# Patient Record
Sex: Male | Born: 1940 | Race: Black or African American | Hispanic: No | Marital: Married | Smoking: Former smoker
Health system: Southern US, Community
[De-identification: ages and names within clinical notes are randomized; demographics above are authoritative.]

## PROBLEM LIST (undated history)

## (undated) DIAGNOSIS — I10 Essential (primary) hypertension: Secondary | ICD-10-CM

## (undated) DIAGNOSIS — F329 Major depressive disorder, single episode, unspecified: Secondary | ICD-10-CM

## (undated) DIAGNOSIS — M1990 Unspecified osteoarthritis, unspecified site: Secondary | ICD-10-CM

## (undated) DIAGNOSIS — E119 Type 2 diabetes mellitus without complications: Secondary | ICD-10-CM

## (undated) DIAGNOSIS — F32A Depression, unspecified: Secondary | ICD-10-CM

## (undated) HISTORY — PX: EYE SURGERY: SHX253

## (undated) HISTORY — PX: APPENDECTOMY: SHX54

## (undated) HISTORY — PX: BACK SURGERY: SHX140

---
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
## 2018-12-03 ENCOUNTER — Other Ambulatory Visit: Payer: Self-pay | Admitting: Orthopedic Surgery

## 2018-12-23 NOTE — Patient Instructions (Addendum)
Curtis Mason  12/23/2018   Your procedure is scheduled on: Monday 12/29/2018   [facility]              Report to admitting at  0945  AM               YOU NEED TO HAVE A COVID 19 TEST ON 12-25-18  @ 10:15 AM_______, THIS TEST MUST BE DONE BEFORE SURGERY, [facility].              ONCE YOUR COVID TEST IS COMPLETED, PLEASE BEGIN THE QUARANTINE INSTRUCTIONS AS OUTLINED IN YOUR HANDOUT.   Call this number if you have problems the morning of surgery [phone]    Remember: Do not eat food  :After Midnight.               NO SOLID FOOD AFTER MIDNIGHT THE NIGHT PRIOR TO SURGERY. NOTHING BY MOUTH EXCEPT CLEAR LIQUIDS UNTIL 3 HOURS PRIOR TO SCHEULED SURGERY.             PLEASE FINISH  Gatorade G 2  DRINK PER SURGEON ORDER 3 HOURS PRIOR TO SCHEDULED SURGERY TIME WHICH NEEDS TO BE COMPLETED AT  0915 am.   CLEAR LIQUID DIET   Foods Allowed                                                                     Foods Excluded  Coffee and tea, regular and decaf                             liquids that you cannot  Plain Jell-O in any flavor                                             see through such as: Fruit ices (not with fruit pulp)                                     milk, soups, orange juice  Iced Popsicles                                    All solid food Carbonated beverages, regular and diet                                    Cranberry, grape and apple juices Sports drinks like Gatorade Lightly seasoned clear broth or consume(fat free) Sugar, honey syrup  Sample Menu Breakfast                                Lunch  Supper Cranberry juice                    Beef broth                            Chicken broth Jell-O                                     Grape juice                           Apple juice Coffee or tea                        Jell-O                                       Popsicle                                                Coffee or tea                        Coffee or tea  _____________________________________________________________________               BRUSH YOUR TEETH MORNING OF SURGERY AND RINSE YOUR MOUTH OUT, NO CHEWING GUM CANDY OR MINTS.     Take these medicines the morning of surgery with A SIP OF WATER: Atorvastatin (Lipitor), Duloxetine (Cymbalta), use eye drops              Do not take any oral Diabetic medications the morning of surgery!                               You may not have any metal on your body including hair pins and              piercings  Do not wear jewelry, cologne, lotions, powders or deodorant                         Men may shave face and neck.   Do not bring valuables to the hospital. [facility] IS NOT             RESPONSIBLE   FOR VALUABLES.  Contacts, dentures or bridgework may not be worn into surgery.                  Please read over the following fact sheets you were given: _____________________________________________________________________  How to Manage Your Diabetes Before and After Surgery  Why is it important to control my blood sugar before and after surgery? . Improving blood sugar levels before and after surgery helps healing and can limit problems. . A way of improving blood sugar control is eating a healthy diet by: o  Eating less sugar and carbohydrates o  Increasing activity/exercise o  Talking with your doctor about reaching your blood sugar goals . High blood sugars (greater than 180 mg/dL) can raise your risk of infections and slow  your recovery, so you will need to focus on controlling your diabetes during the weeks before surgery. . Make sure that the doctor who takes care of your diabetes knows about your planned surgery including the date and location.  How do I manage my blood sugar before surgery? . Check your blood sugar at least 4 times a day, starting 2  days before surgery, to make sure that the level is not too high or low. o Check your blood sugar the morning of your surgery when you wake up and every 2 hours until you get to the Short Stay unit. . If your blood sugar is less than 70 mg/dL, you will need to treat for low blood sugar: o Do not take insulin. o Treat a low blood sugar (less than 70 mg/dL) with  cup of clear juice (cranberry or apple), 4 glucose tablets, OR glucose gel. o Recheck blood sugar in 15 minutes after treatment (to make sure it is greater than 70 mg/dL). If your blood sugar is not greater than 70 mg/dL on recheck, call [phone] for further instructions. . Report your blood sugar to the short stay nurse when you get to Short Stay.  . If you are admitted to the hospital after surgery: o Your blood sugar will be checked by the staff and you will probably be given insulin after surgery (instead of oral diabetes medicines) to make sure you have good blood sugar levels. o The goal for blood sugar control after surgery is 80-180 mg/dL.   WHAT DO I DO ABOUT MY DIABETES MEDICATION?         The day before surgery, Take Metformin (Glucophage) as usual.          The day before surgery, Take the Morning dose of Insulin Aspart (Novolog Flexpen) but DO NOT TAKE EVENING DOSE !  . Do not take oral diabetes medicines (pills) the morning of surgery.  . THE NIGHT BEFORE SURGERY, take    15  units of  Lantus     insulin.      . If your CBG is greater than 220 mg/dL, you may take  of your sliding scale  . (correction) dose of insulin.               Curtis Mason - Preparing for Surgery Before surgery, you can play an important role.  Because skin is not sterile, your skin needs to be as free of germs as possible.  You can reduce the number of germs on your skin by washing with CHG (chlorahexidine gluconate) soap before surgery.  CHG is an antiseptic cleaner which kills germs and bonds with the skin to continue killing germs even  after washing. Please DO NOT use if you have an allergy to CHG or antibacterial soaps.  If your skin becomes reddened/irritated stop using the CHG and inform your nurse when you arrive at Short Stay. Do not shave (including legs and underarms) for at least 48 hours prior to the first CHG shower.  You may shave your face/neck. Please follow these instructions carefully:  1.  Shower with CHG Soap the night before surgery and the  morning of Surgery.  2.  If you choose to wash your hair, wash your hair first as usual with your  normal  shampoo.  3.  After you shampoo, rinse your hair and body thoroughly to remove the  shampoo.  4.  Use CHG as you would any other liquid soap.  You can apply chg directly  to the skin and wash                       Gently with a scrungie or clean washcloth.  5.  Apply the CHG Soap to your body ONLY FROM THE NECK DOWN.   Do not use on face/ open                           Wound or open sores. Avoid contact with eyes, ears mouth and genitals (private parts).                       Wash face,  Genitals (private parts) with your normal soap.             6.  Wash thoroughly, paying special attention to the area where your surgery  will be performed.  7.  Thoroughly rinse your body with warm water from the neck down.  8.  DO NOT shower/wash with your normal soap after using and rinsing off  the CHG Soap.                9.  Pat yourself dry with a clean towel.            10.  Wear clean pajamas.            11.  Place clean sheets on your bed the night of your first shower and do not  sleep with pets. Day of Surgery : Do not apply any lotions/deodorants the morning of surgery.  Please wear clean clothes to the hospital/surgery center.  FAILURE TO FOLLOW THESE INSTRUCTIONS MAY RESULT IN THE CANCELLATION OF YOUR SURGERY PATIENT SIGNATURE_________________________________  NURSE  SIGNATURE__________________________________  ________________________________________________________________________   Adam Phenix  An incentive spirometer is a tool that can help keep your lungs clear and active. This tool measures how well you are filling your lungs with each breath. Taking long deep breaths may help reverse or decrease the chance of developing breathing (pulmonary) problems (especially infection) following:  A long period of time when you are unable to move or be active. BEFORE THE PROCEDURE   If the spirometer includes an indicator to show your best effort, your nurse or respiratory therapist will set it to a desired goal.  If possible, sit up straight or lean slightly forward. Try not to slouch.  Hold the incentive spirometer in an upright position. INSTRUCTIONS FOR USE  1. Sit on the edge of your bed if possible, or sit up as far as you can in bed or on a chair. 2. Hold the incentive spirometer in an upright position. 3. Breathe out normally. 4. Place the mouthpiece in your mouth and seal your lips tightly around it. 5. Breathe in slowly and as deeply as possible, raising the piston or the ball toward the top of the column. 6. Hold your breath for 3-5 seconds or for as long as possible. Allow the piston or ball to fall to the bottom of the column. 7. Remove the mouthpiece from your mouth and breathe out normally. 8. Rest for a few seconds and repeat Steps 1 through 7 at least 10 times every 1-2 hours when you are awake. Take your time and take a few normal breaths between deep breaths. 9. The spirometer may include an indicator to show  your best effort. Use the indicator as a goal to work toward during each repetition. 10. After each set of 10 deep breaths, practice coughing to be sure your lungs are clear. If you have an incision (the cut made at the time of surgery), support your incision when coughing by placing a pillow or rolled up towels firmly  against it. Once you are able to get out of bed, walk around indoors and cough well. You may stop using the incentive spirometer when instructed by your caregiver.  RISKS AND COMPLICATIONS  Take your time so you do not get dizzy or light-headed.  If you are in pain, you may need to take or ask for pain medication before doing incentive spirometry. It is harder to take a deep breath if you are having pain. AFTER USE  Rest and breathe slowly and easily.  It can be helpful to keep track of a log of your progress. Your caregiver can provide you with a simple table to help with this. If you are using the spirometer at home, follow these instructions: [place] IF:   You are having difficultly using the spirometer.  You have trouble using the spirometer as often as instructed.  Your pain medication is not giving enough relief while using the spirometer.  You develop fever of 100.5 F (38.1 C) or higher. SEEK IMMEDIATE MEDICAL CARE IF:   You cough up bloody sputum that had not been present before.  You develop fever of 102 F (38.9 C) or greater.  You develop worsening pain at or near the incision site. MAKE SURE YOU:   Understand these instructions.  Will watch your condition.  Will get help right away if you are not doing well or get worse. Document Released: 10/29/2006 Document Revised: 09/10/2011 Document Reviewed: 12/30/2006 [facility] Patient Information 2014 [place], [place].   ________________________________________________________________________

## 2018-12-24 ENCOUNTER — Other Ambulatory Visit: Payer: Self-pay

## 2018-12-24 ENCOUNTER — Other Ambulatory Visit: Payer: Self-pay | Admitting: Orthopedic Surgery

## 2018-12-24 ENCOUNTER — Ambulatory Visit (HOSPITAL_COMMUNITY)
Admission: RE | Admit: 2018-12-24 | Discharge: 2018-12-24 | Disposition: A | Discharge status: Home / Self Care | Payer: Medicare Other | Source: Ambulatory Visit | Attending: Orthopedic Surgery | Admitting: Orthopedic Surgery

## 2018-12-24 ENCOUNTER — Encounter (HOSPITAL_COMMUNITY): Payer: Self-pay

## 2018-12-24 ENCOUNTER — Encounter (HOSPITAL_COMMUNITY)
Admission: RE | Admit: 2018-12-24 | Discharge: 2018-12-24 | Disposition: A | Discharge status: Home / Self Care | Payer: Medicare Other | Source: Ambulatory Visit | Attending: Orthopedic Surgery | Admitting: Orthopedic Surgery

## 2018-12-24 DIAGNOSIS — E119 Type 2 diabetes mellitus without complications: Secondary | ICD-10-CM | POA: Insufficient documentation

## 2018-12-24 DIAGNOSIS — Z01818 Encounter for other preprocedural examination: Secondary | ICD-10-CM | POA: Insufficient documentation

## 2018-12-24 DIAGNOSIS — Z794 Long term (current) use of insulin: Secondary | ICD-10-CM | POA: Insufficient documentation

## 2018-12-24 DIAGNOSIS — M1612 Unilateral primary osteoarthritis, left hip: Secondary | ICD-10-CM | POA: Insufficient documentation

## 2018-12-24 DIAGNOSIS — I10 Essential (primary) hypertension: Secondary | ICD-10-CM | POA: Diagnosis not present

## 2018-12-24 DIAGNOSIS — Z79899 Other long term (current) drug therapy: Secondary | ICD-10-CM | POA: Insufficient documentation

## 2018-12-24 DIAGNOSIS — Z87891 Personal history of nicotine dependence: Secondary | ICD-10-CM | POA: Insufficient documentation

## 2018-12-24 HISTORY — DX: Essential (primary) hypertension: I10

## 2018-12-24 HISTORY — DX: Type 2 diabetes mellitus without complications: E11.9

## 2018-12-24 HISTORY — DX: Unspecified osteoarthritis, unspecified site: M19.90

## 2018-12-24 HISTORY — DX: Depression, unspecified: F32.A

## 2018-12-24 LAB — CBC WITH DIFFERENTIAL/PLATELET
Abs Immature Granulocytes: 0.04 10*3/uL (ref 0.00–0.07)
Basophils Absolute: 0 10*3/uL (ref 0.0–0.1)
Basophils Relative: 1 %
Eosinophils Absolute: 0.1 10*3/uL (ref 0.0–0.5)
Eosinophils Relative: 2 %
HCT: 42.9 % (ref 39.0–52.0)
Hemoglobin: 13.3 g/dL (ref 13.0–17.0)
Immature Granulocytes: 1 %
Lymphocytes Relative: 29 %
Lymphs Abs: 1.9 10*3/uL (ref 0.7–4.0)
MCH: 27.1 pg (ref 26.0–34.0)
MCHC: 31 g/dL (ref 30.0–36.0)
MCV: 87.6 fL (ref 80.0–100.0)
Monocytes Absolute: 0.6 10*3/uL (ref 0.1–1.0)
Monocytes Relative: 9 %
Neutro Abs: 3.9 10*3/uL (ref 1.7–7.7)
Neutrophils Relative %: 58 %
Platelets: 173 10*3/uL (ref 150–400)
RBC: 4.9 MIL/uL (ref 4.22–5.81)
RDW: 13.9 % (ref 11.5–15.5)
WBC: 6.6 10*3/uL (ref 4.0–10.5)
nRBC: 0 % (ref 0.0–0.2)

## 2018-12-24 LAB — URINALYSIS, ROUTINE W REFLEX MICROSCOPIC
Bilirubin Urine: NEGATIVE
Glucose, UA: NEGATIVE mg/dL
Hgb urine dipstick: NEGATIVE
Ketones, ur: NEGATIVE mg/dL
Leukocytes,Ua: NEGATIVE
Nitrite: NEGATIVE
Protein, ur: NEGATIVE mg/dL
Specific Gravity, Urine: 1.008 (ref 1.005–1.030)
pH: 5 (ref 5.0–8.0)

## 2018-12-24 LAB — BASIC METABOLIC PANEL
Anion gap: 9 (ref 5–15)
BUN: 16 mg/dL (ref 8–23)
CO2: 25 mmol/L (ref 22–32)
Calcium: 9.3 mg/dL (ref 8.9–10.3)
Chloride: 107 mmol/L (ref 98–111)
Creatinine, Ser: 0.91 mg/dL (ref 0.61–1.24)
GFR calc Af Amer: >60 mL/min (ref >60)
GFR calc non Af Amer: >60 mL/min (ref >60)
Glucose, Bld: 76 mg/dL (ref 70–99)
Potassium: 5.1 mmol/L (ref 3.5–5.1)
Sodium: 141 mmol/L (ref 135–145)

## 2018-12-24 LAB — HEMOGLOBIN A1C
Hgb A1c MFr Bld: 7.2 % — ABNORMAL HIGH (ref 4.8–5.6)
Mean Plasma Glucose: 159.94 mg/dL

## 2018-12-24 LAB — SURGICAL PCR SCREEN
MRSA, PCR: NEGATIVE
Staphylococcus aureus: NEGATIVE

## 2018-12-24 LAB — APTT: aPTT: 28 seconds (ref 24–36)

## 2018-12-24 LAB — PROTIME-INR
INR: 1 (ref 0.8–1.2)
Prothrombin Time: 13.3 seconds (ref 11.4–15.2)

## 2018-12-24 LAB — GLUCOSE, CAPILLARY: Glucose-Capillary: 78 mg/dL (ref 70–99)

## 2018-12-24 NOTE — Care Plan (Signed)
Spoke with patient and wife prior to surgery. He will discharge to home with family and HHPT. Referral to [facility] in [place]. Please fax discharge information to them. Referral has already been sent. Patient prefers not to go to OPPT facility and MD is in agreement with this. Will not have this arranged. Patient and MD aware of plan and in agreement. Choice offered.    Ladell Heads, [place]

## 2018-12-24 NOTE — H&P (Signed)
TOTAL HIP ADMISSION H&P  Patient is admitted for left total hip arthroplasty.  Subjective:  Chief Complaint: left hip pain  HPI: Curtis Mason, 78 y.o. male, has a history of pain and functional disability in the left hip(s) due to arthritis and patient has failed non-surgical conservative treatments for greater than 12 weeks to include NSAID's and/or analgesics, supervised PT with diminished ADL's post treatment, weight reduction as appropriate and activity modification.  Onset of symptoms was abrupt starting 1 years ago with rapidlly worsening course since that time.The patient noted no past surgery on the left hip(s).  Patient currently rates pain in the left hip at 10 out of 10 with activity. Patient has night pain, worsening of pain with activity and weight bearing, trendelenberg gait, pain that interfers with activities of daily living and pain with passive range of motion. Patient has evidence of joint space narrowing by imaging studies. This condition presents safety issues increasing the risk of falls.   There is no current active infection.  There are no active problems to display for this patient.  Past Medical History:  Diagnosis Date  . Arthritis   . Depression   . Diabetes mellitus without complication ([place])   . Hypertension     Past Surgical History:  Procedure Laterality Date  . APPENDECTOMY    . BACK SURGERY     L4-L5, L5-L6  . EYE SURGERY Bilateral    Cataract    No current facility-administered medications for this encounter.    Current Outpatient Medications  Medication Sig Dispense Refill Last Dose  . atorvastatin (LIPITOR) 40 MG tablet Take 40 mg by mouth daily.     . brimonidine (ALPHAGAN) 0.2 % ophthalmic solution Place 1 drop into both eyes 2 (two) times a day.     . Cholecalciferol (VITAMIN D) 50 MCG (2000 UT) CAPS Take 2,000 Units by mouth daily.     . dorzolamide (TRUSOPT) 2 % ophthalmic solution Place 1 drop into both eyes 2 (two) times daily.     .  DULoxetine (CYMBALTA) 60 MG capsule Take 60 mg by mouth daily.     . Insulin Aspart (NOVOLOG FLEXPEN [place]) Inject 5.5 Units into the skin 2 (two) times daily with a meal.     . insulin glargine (LANTUS) 100 UNIT/ML injection Inject 30 Units into the skin at bedtime.     Marland Kitchen lisinopril (ZESTRIL) 20 MG tablet Take 20 mg by mouth daily.     . meloxicam (MOBIC) 7.5 MG tablet Take 7.5 mg by mouth daily.     . metFORMIN (GLUCOPHAGE) 1000 MG tablet Take 1,000 mg by mouth 2 (two) times daily with a meal.     . timolol (BETIMOL) 0.5 % ophthalmic solution Place 2 drops into both eyes daily.      Allergies  Allergen Reactions  . Blood-Group Specific Substance     Social History   Tobacco Use  . Smoking status: Former Smoker    Packs/day: 3.00    Years: 20.00    Pack years: 60.00    Types: Cigarettes    Quit date: 2011    Years since quitting: 9.4  . Smokeless tobacco: Never Used  Substance Use Topics  . Alcohol use: Not Currently    No family history on file.   Review of Systems  Constitutional: Negative.   HENT: Negative.   Eyes: Positive for blurred vision.  Respiratory: Negative.   Cardiovascular:       Htn  Gastrointestinal: Negative.  Genitourinary: Negative.   Musculoskeletal: Positive for joint pain.  Skin: Negative.   Neurological: Negative.   Endo/Heme/Allergies:       Diabetes  Psychiatric/Behavioral: Negative.     Objective:  Physical Exam  Constitutional: He is oriented to person, place, and time. He appears well-nourished.  HENT:  Head: Normocephalic and atraumatic.  Eyes: Pupils are equal, round, and reactive to light.  Neck: Normal range of motion. Neck supple.  Cardiovascular: Intact distal pulses.  Respiratory: Effort normal.  Musculoskeletal:        General: Tenderness present.  Neurological: He is alert and oriented to person, place, and time.  Skin: Skin is warm and dry.  Psychiatric: He has a normal mood and affect. His behavior is normal. Judgment  and thought content normal.    Vital signs in last 24 hours: Temp:  [97.8 F (36.6 C)] 97.8 F (36.6 C) (06/24 0954) Pulse Rate:  [80] 80 (06/24 0954) Resp:  [18] 18 (06/24 0954) BP: (145)/(73) 145/73 (06/24 0954) SpO2:  [100 %] 100 % (06/24 0954) Weight:  [92.3 kg] 92.3 kg (06/24 0954)  Labs:   Estimated body mass index is 30.92 kg/m as calculated from the following:   Height as of 12/24/18: 5\' 8"  (1.727 m).   Weight as of 12/24/18: 92.3 kg.   Imaging Review Plain radiographs demonstrate  AP pelvis and crosstable lateral left hip shows bone-on-bone end-stage arthritis with peripheral osteophytes and subchondral cysts.      Assessment/Plan:  End stage arthritis, left hip(s)  The patient history, physical examination, clinical judgement of the provider and imaging studies are consistent with end stage degenerative joint disease of the left hip(s) and total hip arthroplasty is deemed medically necessary. The treatment options including medical management, injection therapy, arthroscopy and arthroplasty were discussed at length. The risks and benefits of total hip arthroplasty were presented and reviewed. The risks due to aseptic loosening, infection, stiffness, dislocation/subluxation,  thromboembolic complications and other imponderables were discussed.  The patient acknowledged the explanation, agreed to proceed with the plan and consent was signed. Patient is being admitted for inpatient treatment for surgery, pain control, PT, OT, prophylactic antibiotics, VTE prophylaxis, progressive ambulation and ADL's and discharge planning.The patient is planning to be discharged home with home health services   Anticipated LOS equal to or greater than 2 midnights due to - Age 78 and older with one or more of the following:  - Obesity  - Expected need for hospital services (PT, OT, Nursing) required for safe  discharge  - Anticipated need for postoperative skilled nursing care or  inpatient rehab  - Active co-morbidities: Diabetes

## 2018-12-25 ENCOUNTER — Other Ambulatory Visit (HOSPITAL_COMMUNITY)
Admission: RE | Admit: 2018-12-25 | Discharge: 2018-12-25 | Disposition: A | Discharge status: Home / Self Care | Payer: Medicare Other | Source: Ambulatory Visit | Attending: Orthopedic Surgery | Admitting: Orthopedic Surgery

## 2018-12-25 DIAGNOSIS — Z1159 Encounter for screening for other viral diseases: Secondary | ICD-10-CM | POA: Insufficient documentation

## 2018-12-25 LAB — SARS CORONAVIRUS 2 (TAT 6-24 HRS): SARS Coronavirus 2: NEGATIVE

## 2018-12-25 LAB — NO BLOOD PRODUCTS

## 2018-12-25 NOTE — Progress Notes (Signed)
Refusal of All Blood And/Or Blood Products faxed to [facility] and Dr. Mayer Camel. 'Successful transmission notice' of f fax on chart.

## 2018-12-28 MED ORDER — TRANEXAMIC ACID 1000 MG/10ML IV SOLN
2000.0000 mg | INTRAVENOUS | Status: DC
  Filled 2018-12-28: qty 20

## 2018-12-28 MED ORDER — BUPIVACAINE LIPOSOME 1.3 % IJ SUSP
10.0000 mL | INTRAMUSCULAR | Status: DC
  Filled 2018-12-28: qty 10

## 2018-12-29 ENCOUNTER — Encounter (HOSPITAL_COMMUNITY)
Admission: RE | Disposition: A | Discharge status: Home / Self Care | Payer: Self-pay | Source: Ambulatory Visit | Attending: Orthopedic Surgery

## 2018-12-29 ENCOUNTER — Observation Stay (HOSPITAL_COMMUNITY)
Admission: RE | Admit: 2018-12-29 | Discharge: 2018-12-30 | Disposition: A | Discharge status: Home / Self Care | Payer: Medicare Other | Source: Ambulatory Visit | Attending: Orthopedic Surgery | Admitting: Orthopedic Surgery

## 2018-12-29 ENCOUNTER — Encounter (HOSPITAL_COMMUNITY): Payer: Self-pay | Admitting: *Deleted

## 2018-12-29 ENCOUNTER — Inpatient Hospital Stay (HOSPITAL_COMMUNITY): Payer: Medicare Other | Admitting: Physician Assistant

## 2018-12-29 ENCOUNTER — Other Ambulatory Visit: Payer: Self-pay

## 2018-12-29 ENCOUNTER — Inpatient Hospital Stay (HOSPITAL_COMMUNITY): Payer: Medicare Other | Admitting: Certified Registered Nurse Anesthetist

## 2018-12-29 ENCOUNTER — Inpatient Hospital Stay (HOSPITAL_COMMUNITY): Payer: Medicare Other

## 2018-12-29 DIAGNOSIS — Z6830 Body mass index (BMI) 30.0-30.9, adult: Secondary | ICD-10-CM | POA: Diagnosis not present

## 2018-12-29 DIAGNOSIS — Z7982 Long term (current) use of aspirin: Secondary | ICD-10-CM | POA: Insufficient documentation

## 2018-12-29 DIAGNOSIS — Z794 Long term (current) use of insulin: Secondary | ICD-10-CM | POA: Diagnosis not present

## 2018-12-29 DIAGNOSIS — Z79899 Other long term (current) drug therapy: Secondary | ICD-10-CM | POA: Insufficient documentation

## 2018-12-29 DIAGNOSIS — Z791 Long term (current) use of non-steroidal anti-inflammatories (NSAID): Secondary | ICD-10-CM | POA: Insufficient documentation

## 2018-12-29 DIAGNOSIS — F329 Major depressive disorder, single episode, unspecified: Secondary | ICD-10-CM | POA: Insufficient documentation

## 2018-12-29 DIAGNOSIS — E119 Type 2 diabetes mellitus without complications: Secondary | ICD-10-CM | POA: Insufficient documentation

## 2018-12-29 DIAGNOSIS — Z87891 Personal history of nicotine dependence: Secondary | ICD-10-CM | POA: Diagnosis not present

## 2018-12-29 DIAGNOSIS — M25552 Pain in left hip: Secondary | ICD-10-CM

## 2018-12-29 DIAGNOSIS — E669 Obesity, unspecified: Secondary | ICD-10-CM | POA: Insufficient documentation

## 2018-12-29 DIAGNOSIS — M1612 Unilateral primary osteoarthritis, left hip: Secondary | ICD-10-CM | POA: Diagnosis present

## 2018-12-29 DIAGNOSIS — Z96642 Presence of left artificial hip joint: Secondary | ICD-10-CM

## 2018-12-29 HISTORY — PX: TOTAL HIP ARTHROPLASTY: SHX124

## 2018-12-29 LAB — GLUCOSE, CAPILLARY
Glucose-Capillary: 105 mg/dL — ABNORMAL HIGH (ref 70–99)
Glucose-Capillary: 154 mg/dL — ABNORMAL HIGH (ref 70–99)
Glucose-Capillary: 145 mg/dL — ABNORMAL HIGH (ref 70–99)
Glucose-Capillary: 249 mg/dL — ABNORMAL HIGH (ref 70–99)

## 2018-12-29 SURGERY — ARTHROPLASTY, HIP, TOTAL, ANTERIOR APPROACH
Anesthesia: Spinal | Site: Hip | Laterality: Left

## 2018-12-29 MED ORDER — FENTANYL CITRATE (PF) 100 MCG/2ML IJ SOLN: 25.0000 ug | INTRAMUSCULAR | Status: DC | PRN

## 2018-12-29 MED ORDER — BUPIVACAINE LIPOSOME 1.3 % IJ SUSP
INTRAMUSCULAR | Status: DC | PRN
  Administered 2018-12-29: 10 mL

## 2018-12-29 MED ORDER — ASPIRIN EC 81 MG PO TBEC: 81.0000 mg | DELAYED_RELEASE_TABLET | Freq: Two times a day (BID) | ORAL | 0 refills | Status: AC

## 2018-12-29 MED ORDER — OXYCODONE-ACETAMINOPHEN 5-325 MG PO TABS: 1.0000 | ORAL_TABLET | ORAL | 0 refills | Status: AC | PRN

## 2018-12-29 MED ORDER — METOCLOPRAMIDE HCL 5 MG PO TABS: 5.0000 mg | ORAL_TABLET | Freq: Three times a day (TID) | ORAL | Status: DC | PRN

## 2018-12-29 MED ORDER — TIMOLOL MALEATE 0.5 % OP SOLN
1.0000 [drp] | Freq: Two times a day (BID) | OPHTHALMIC | Status: DC
  Administered 2018-12-29 – 2018-12-30 (×2): 1 [drp] via OPHTHALMIC
  Filled 2018-12-29: qty 5

## 2018-12-29 MED ORDER — TRANEXAMIC ACID 1000 MG/10ML IV SOLN
INTRAVENOUS | Status: DC | PRN
  Administered 2018-12-29: 2000 mg via TOPICAL

## 2018-12-29 MED ORDER — GABAPENTIN 300 MG PO CAPS
300.0000 mg | ORAL_CAPSULE | Freq: Three times a day (TID) | ORAL | Status: DC
  Administered 2018-12-29 – 2018-12-30 (×3): 300 mg via ORAL
  Filled 2018-12-29 (×3): qty 1

## 2018-12-29 MED ORDER — DOCUSATE SODIUM 100 MG PO CAPS
100.0000 mg | ORAL_CAPSULE | Freq: Two times a day (BID) | ORAL | Status: DC
  Administered 2018-12-29 – 2018-12-30 (×2): 100 mg via ORAL
  Filled 2018-12-29 (×2): qty 1

## 2018-12-29 MED ORDER — INSULIN ASPART 100 UNIT/ML ~~LOC~~ SOLN
6.0000 [IU] | Freq: Two times a day (BID) | SUBCUTANEOUS | Status: DC
  Administered 2018-12-29 – 2018-12-30 (×2): 6 [IU] via SUBCUTANEOUS

## 2018-12-29 MED ORDER — BRIMONIDINE TARTRATE 0.2 % OP SOLN
1.0000 [drp] | Freq: Two times a day (BID) | OPHTHALMIC | Status: DC
  Administered 2018-12-29 – 2018-12-30 (×2): 1 [drp] via OPHTHALMIC
  Filled 2018-12-29: qty 5

## 2018-12-29 MED ORDER — CHLORHEXIDINE GLUCONATE 4 % EX LIQD: 60.0000 mL | Freq: Once | CUTANEOUS | Status: DC

## 2018-12-29 MED ORDER — MEPERIDINE HCL 50 MG/ML IJ SOLN: 6.2500 mg | INTRAMUSCULAR | Status: DC | PRN

## 2018-12-29 MED ORDER — ALUM & MAG HYDROXIDE-SIMETH 200-200-20 MG/5ML PO SUSP: 30.0000 mL | ORAL | Status: DC | PRN

## 2018-12-29 MED ORDER — KCL IN DEXTROSE-NACL 20-5-0.45 MEQ/L-%-% IV SOLN
INTRAVENOUS | Status: DC
  Administered 2018-12-29 – 2018-12-30 (×2): via INTRAVENOUS
  Filled 2018-12-29 (×3): qty 1000

## 2018-12-29 MED ORDER — LIDOCAINE 2% (20 MG/ML) 5 ML SYRINGE
INTRAMUSCULAR | Status: DC | PRN
  Administered 2018-12-29: 8 mg via INTRAVENOUS

## 2018-12-29 MED ORDER — PROPOFOL 10 MG/ML IV BOLUS
INTRAVENOUS | Status: DC | PRN
  Administered 2018-12-29: 20 mg via INTRAVENOUS
  Administered 2018-12-29: 100 mg via INTRAVENOUS
  Administered 2018-12-29: 20 mg via INTRAVENOUS
  Administered 2018-12-29: 50 mg via INTRAVENOUS

## 2018-12-29 MED ORDER — METOCLOPRAMIDE HCL 5 MG/ML IJ SOLN: 5.0000 mg | Freq: Three times a day (TID) | INTRAMUSCULAR | Status: DC | PRN

## 2018-12-29 MED ORDER — DEXAMETHASONE SODIUM PHOSPHATE 10 MG/ML IJ SOLN
INTRAMUSCULAR | Status: DC | PRN
  Administered 2018-12-29: 5 mg via INTRAVENOUS

## 2018-12-29 MED ORDER — METFORMIN HCL 500 MG PO TABS
1000.0000 mg | ORAL_TABLET | Freq: Two times a day (BID) | ORAL | Status: DC
  Administered 2018-12-29 – 2018-12-30 (×2): 1000 mg via ORAL
  Filled 2018-12-29 (×2): qty 2

## 2018-12-29 MED ORDER — ROCURONIUM BROMIDE 10 MG/ML (PF) SYRINGE
PREFILLED_SYRINGE | INTRAVENOUS | Status: DC | PRN
  Administered 2018-12-29: 30 mg via INTRAVENOUS
  Administered 2018-12-29: 10 mg via INTRAVENOUS

## 2018-12-29 MED ORDER — TRANEXAMIC ACID-NACL 1000-0.7 MG/100ML-% IV SOLN
1000.0000 mg | Freq: Once | INTRAVENOUS | Status: AC
  Administered 2018-12-29: 17:00:00 1000 mg via INTRAVENOUS
  Filled 2018-12-29: qty 100

## 2018-12-29 MED ORDER — TIZANIDINE HCL 2 MG PO TABS: 2.0000 mg | ORAL_TABLET | Freq: Four times a day (QID) | ORAL | 0 refills | Status: AC | PRN

## 2018-12-29 MED ORDER — OXYCODONE HCL 5 MG PO TABS: 5.0000 mg | ORAL_TABLET | ORAL | Status: DC | PRN

## 2018-12-29 MED ORDER — POLYETHYLENE GLYCOL 3350 17 G PO PACK: 17.0000 g | PACK | Freq: Every day | ORAL | Status: DC | PRN

## 2018-12-29 MED ORDER — SUGAMMADEX SODIUM 200 MG/2ML IV SOLN
INTRAVENOUS | Status: DC | PRN
  Administered 2018-12-29: 200 mg via INTRAVENOUS

## 2018-12-29 MED ORDER — SODIUM CHLORIDE 0.9 % IV SOLN
INTRAVENOUS | Status: DC
  Administered 2018-12-29: 11:00:00 via INTRAVENOUS

## 2018-12-29 MED ORDER — TIMOLOL HEMIHYDRATE 0.5 % OP SOLN: 2.0000 [drp] | Freq: Every day | OPHTHALMIC | Status: DC

## 2018-12-29 MED ORDER — FENTANYL CITRATE (PF) 100 MCG/2ML IJ SOLN
INTRAMUSCULAR | Status: AC
  Filled 2018-12-29: qty 2

## 2018-12-29 MED ORDER — FENTANYL CITRATE (PF) 250 MCG/5ML IJ SOLN
INTRAMUSCULAR | Status: DC | PRN
  Administered 2018-12-29 (×6): 50 ug via INTRAVENOUS

## 2018-12-29 MED ORDER — METHOCARBAMOL 500 MG IVPB - SIMPLE MED
500.0000 mg | Freq: Four times a day (QID) | INTRAVENOUS | Status: DC | PRN
  Filled 2018-12-29: qty 50

## 2018-12-29 MED ORDER — PROPOFOL 10 MG/ML IV BOLUS
INTRAVENOUS | Status: AC
  Filled 2018-12-29: qty 20

## 2018-12-29 MED ORDER — DULOXETINE HCL 60 MG PO CPEP
60.0000 mg | ORAL_CAPSULE | Freq: Every day | ORAL | Status: DC
  Administered 2018-12-30: 60 mg via ORAL
  Filled 2018-12-29: qty 1

## 2018-12-29 MED ORDER — ALUMINUM HYDROXIDE GEL 320 MG/5ML PO SUSP: 15.0000 mL | ORAL | Status: DC | PRN

## 2018-12-29 MED ORDER — ONDANSETRON HCL 4 MG/2ML IJ SOLN
INTRAMUSCULAR | Status: DC | PRN
  Administered 2018-12-29: 4 mg via INTRAVENOUS

## 2018-12-29 MED ORDER — DIPHENHYDRAMINE HCL 12.5 MG/5ML PO ELIX: 12.5000 mg | ORAL_SOLUTION | ORAL | Status: DC | PRN

## 2018-12-29 MED ORDER — OXYCODONE HCL 5 MG/5ML PO SOLN: 5.0000 mg | Freq: Once | ORAL | Status: DC | PRN

## 2018-12-29 MED ORDER — ONDANSETRON HCL 4 MG/2ML IJ SOLN: 4.0000 mg | Freq: Once | INTRAMUSCULAR | Status: DC | PRN

## 2018-12-29 MED ORDER — LACTATED RINGERS IV SOLN: INTRAVENOUS | Status: DC

## 2018-12-29 MED ORDER — FLEET ENEMA 7-19 GM/118ML RE ENEM: 1.0000 | ENEMA | Freq: Once | RECTAL | Status: DC | PRN

## 2018-12-29 MED ORDER — DEXAMETHASONE SODIUM PHOSPHATE 10 MG/ML IJ SOLN
10.0000 mg | Freq: Once | INTRAMUSCULAR | Status: AC
  Administered 2018-12-30: 10 mg via INTRAVENOUS
  Filled 2018-12-29: qty 1

## 2018-12-29 MED ORDER — INSULIN GLARGINE 100 UNIT/ML ~~LOC~~ SOLN
30.0000 [IU] | Freq: Every day | SUBCUTANEOUS | Status: DC
  Administered 2018-12-29: 30 [IU] via SUBCUTANEOUS
  Filled 2018-12-29 (×2): qty 0.3

## 2018-12-29 MED ORDER — BISACODYL 5 MG PO TBEC: 5.0000 mg | DELAYED_RELEASE_TABLET | Freq: Every day | ORAL | Status: DC | PRN

## 2018-12-29 MED ORDER — ASPIRIN 81 MG PO CHEW
81.0000 mg | CHEWABLE_TABLET | Freq: Two times a day (BID) | ORAL | Status: DC
  Administered 2018-12-29 – 2018-12-30 (×2): 81 mg via ORAL
  Filled 2018-12-29 (×2): qty 1

## 2018-12-29 MED ORDER — TRANEXAMIC ACID-NACL 1000-0.7 MG/100ML-% IV SOLN
1000.0000 mg | INTRAVENOUS | Status: AC
  Administered 2018-12-29: 1000 mg via INTRAVENOUS
  Filled 2018-12-29: qty 100

## 2018-12-29 MED ORDER — LISINOPRIL 20 MG PO TABS
20.0000 mg | ORAL_TABLET | Freq: Every day | ORAL | Status: DC
  Administered 2018-12-30: 09:00:00 20 mg via ORAL
  Filled 2018-12-29: qty 1

## 2018-12-29 MED ORDER — SODIUM CHLORIDE (PF) 0.9 % IJ SOLN
INTRAMUSCULAR | Status: AC
  Filled 2018-12-29: qty 50

## 2018-12-29 MED ORDER — CELECOXIB 200 MG PO CAPS
200.0000 mg | ORAL_CAPSULE | Freq: Two times a day (BID) | ORAL | Status: DC
  Administered 2018-12-29 – 2018-12-30 (×2): 200 mg via ORAL
  Filled 2018-12-29 (×2): qty 1

## 2018-12-29 MED ORDER — POVIDONE-IODINE 10 % EX SWAB
2.0000 "application " | Freq: Once | CUTANEOUS | Status: AC
  Administered 2018-12-29: 2 via TOPICAL

## 2018-12-29 MED ORDER — SUCCINYLCHOLINE CHLORIDE 200 MG/10ML IV SOSY
PREFILLED_SYRINGE | INTRAVENOUS | Status: DC | PRN
  Administered 2018-12-29: 100 mg via INTRAVENOUS

## 2018-12-29 MED ORDER — ONDANSETRON HCL 4 MG PO TABS: 4.0000 mg | ORAL_TABLET | Freq: Four times a day (QID) | ORAL | Status: DC | PRN

## 2018-12-29 MED ORDER — ACETAMINOPHEN 325 MG PO TABS: 325.0000 mg | ORAL_TABLET | ORAL | Status: DC | PRN

## 2018-12-29 MED ORDER — LACTATED RINGERS IV SOLN
INTRAVENOUS | Status: DC
  Administered 2018-12-29 (×2): via INTRAVENOUS

## 2018-12-29 MED ORDER — SODIUM CHLORIDE 0.9% FLUSH
INTRAVENOUS | Status: DC | PRN
  Administered 2018-12-29: 50 mL

## 2018-12-29 MED ORDER — DORZOLAMIDE HCL 2 % OP SOLN
1.0000 [drp] | Freq: Two times a day (BID) | OPHTHALMIC | Status: DC
  Administered 2018-12-29 – 2018-12-30 (×2): 1 [drp] via OPHTHALMIC
  Filled 2018-12-29: qty 10

## 2018-12-29 MED ORDER — MENTHOL 3 MG MT LOZG: 1.0000 | LOZENGE | OROMUCOSAL | Status: DC | PRN

## 2018-12-29 MED ORDER — CEFAZOLIN SODIUM-DEXTROSE 2-4 GM/100ML-% IV SOLN
2.0000 g | INTRAVENOUS | Status: AC
  Administered 2018-12-29: 2 g via INTRAVENOUS
  Filled 2018-12-29: qty 100

## 2018-12-29 MED ORDER — PROPOFOL 10 MG/ML IV BOLUS
INTRAVENOUS | Status: AC
  Filled 2018-12-29: qty 60

## 2018-12-29 MED ORDER — PANTOPRAZOLE SODIUM 40 MG PO TBEC
40.0000 mg | DELAYED_RELEASE_TABLET | Freq: Every day | ORAL | Status: DC
  Administered 2018-12-29 – 2018-12-30 (×2): 40 mg via ORAL
  Filled 2018-12-29 (×2): qty 1

## 2018-12-29 MED ORDER — ACETAMINOPHEN 160 MG/5ML PO SOLN: 325.0000 mg | ORAL | Status: DC | PRN

## 2018-12-29 MED ORDER — BUPIVACAINE-EPINEPHRINE 0.25% -1:200000 IJ SOLN
INTRAMUSCULAR | Status: DC | PRN
  Administered 2018-12-29: 30 mL

## 2018-12-29 MED ORDER — PHENOL 1.4 % MT LIQD: 1.0000 | OROMUCOSAL | Status: DC | PRN

## 2018-12-29 MED ORDER — STERILE WATER FOR IRRIGATION IR SOLN
Status: DC | PRN
  Administered 2018-12-29: 2000 mL

## 2018-12-29 MED ORDER — INSULIN ASPART 100 UNIT/ML ~~LOC~~ SOLN
0.0000 [IU] | Freq: Three times a day (TID) | SUBCUTANEOUS | Status: DC
  Administered 2018-12-29: 2 [IU] via SUBCUTANEOUS
  Administered 2018-12-30: 09:00:00 8 [IU] via SUBCUTANEOUS
  Administered 2018-12-30: 3 [IU] via SUBCUTANEOUS

## 2018-12-29 MED ORDER — SUGAMMADEX SODIUM 200 MG/2ML IV SOLN
INTRAVENOUS | Status: AC
  Filled 2018-12-29: qty 2

## 2018-12-29 MED ORDER — ACETAMINOPHEN 500 MG PO TABS
1000.0000 mg | ORAL_TABLET | Freq: Four times a day (QID) | ORAL | Status: DC
  Administered 2018-12-29 – 2018-12-30 (×3): 1000 mg via ORAL
  Filled 2018-12-29 (×3): qty 2

## 2018-12-29 MED ORDER — 0.9 % SODIUM CHLORIDE (POUR BTL) OPTIME
TOPICAL | Status: DC | PRN
  Administered 2018-12-29: 1000 mL

## 2018-12-29 MED ORDER — FENTANYL CITRATE (PF) 100 MCG/2ML IJ SOLN: 50.0000 ug | INTRAMUSCULAR | Status: DC

## 2018-12-29 MED ORDER — ACETAMINOPHEN 325 MG PO TABS: 325.0000 mg | ORAL_TABLET | Freq: Four times a day (QID) | ORAL | Status: DC | PRN

## 2018-12-29 MED ORDER — METHOCARBAMOL 500 MG PO TABS: 500.0000 mg | ORAL_TABLET | Freq: Four times a day (QID) | ORAL | Status: DC | PRN

## 2018-12-29 MED ORDER — HYDROMORPHONE HCL 1 MG/ML IJ SOLN: 0.5000 mg | INTRAMUSCULAR | Status: DC | PRN

## 2018-12-29 MED ORDER — OXYCODONE HCL 5 MG PO TABS: 5.0000 mg | ORAL_TABLET | Freq: Once | ORAL | Status: DC | PRN

## 2018-12-29 MED ORDER — ONDANSETRON HCL 4 MG/2ML IJ SOLN: 4.0000 mg | Freq: Four times a day (QID) | INTRAMUSCULAR | Status: DC | PRN

## 2018-12-29 MED ORDER — BUPIVACAINE-EPINEPHRINE (PF) 0.25% -1:200000 IJ SOLN
INTRAMUSCULAR | Status: AC
  Filled 2018-12-29: qty 30

## 2018-12-29 SURGICAL SUPPLY — 48 items
BAG DECANTER FOR FLEXI CONT (MISCELLANEOUS) ×3 IMPLANT
BLADE SAW SGTL 18X1.27X75 (BLADE) ×2 IMPLANT
BLADE SAW SGTL 18X1.27X75MM (BLADE) ×1
BLADE SURG SZ10 CARB STEEL (BLADE) ×6 IMPLANT
CHLORAPREP W/TINT 26 (MISCELLANEOUS) IMPLANT
CONT SPEC 4OZ CLIKSEAL STRL BL (MISCELLANEOUS) IMPLANT
COVER PERINEAL POST (MISCELLANEOUS) ×3 IMPLANT
COVER SURGICAL LIGHT HANDLE (MISCELLANEOUS) ×3 IMPLANT
COVER WAND RF STERILE (DRAPES) ×3 IMPLANT
CUP ACETBLR 54 OD 100 SERIES (Hips) ×3 IMPLANT
DECANTER SPIKE VIAL GLASS SM (MISCELLANEOUS) ×6 IMPLANT
DRAPE STERI IOBAN 125X83 (DRAPES) ×3 IMPLANT
DRAPE U-SHAPE 47X51 STRL (DRAPES) ×6 IMPLANT
DRSG AQUACEL AG ADV 3.5X10 (GAUZE/BANDAGES/DRESSINGS) ×3 IMPLANT
DURAPREP 26ML APPLICATOR (WOUND CARE) ×3 IMPLANT
ELECT BLADE TIP CTD 4 INCH (ELECTRODE) ×3 IMPLANT
ELECT REM PT RETURN 15FT ADLT (MISCELLANEOUS) ×3 IMPLANT
ELIMINATOR HOLE APEX DEPUY (Hips) ×3 IMPLANT
GLOVE BIO SURGEON STRL SZ7.5 (GLOVE) ×3 IMPLANT
GLOVE BIO SURGEON STRL SZ8.5 (GLOVE) ×3 IMPLANT
GLOVE BIOGEL PI IND STRL 8 (GLOVE) ×1 IMPLANT
GLOVE BIOGEL PI IND STRL 9 (GLOVE) ×1 IMPLANT
GLOVE BIOGEL PI INDICATOR 8 (GLOVE) ×2
GLOVE BIOGEL PI INDICATOR 9 (GLOVE) ×2
GOWN STRL REUS W/TWL XL LVL3 (GOWN DISPOSABLE) ×6 IMPLANT
HEAD M SROM 36MM PLUS 1.5 (Hips) ×1 IMPLANT
HOLDER FOLEY CATH W/STRAP (MISCELLANEOUS) ×3 IMPLANT
KIT TURNOVER KIT A (KITS) IMPLANT
LINER NEUTRAL 54X36MM PLUS 4 (Hips) ×3 IMPLANT
MANIFOLD NEPTUNE II (INSTRUMENTS) ×3 IMPLANT
NEEDLE HYPO 21X1.5 SAFETY (NEEDLE) ×6 IMPLANT
NS IRRIG 1000ML POUR BTL (IV SOLUTION) ×3 IMPLANT
PACK ANTERIOR HIP CUSTOM (KITS) ×3 IMPLANT
SROM M HEAD 36MM PLUS 1.5 (Hips) ×3 IMPLANT
STEM FEMORAL SZ9 HIGH ACTIS (Stem) ×3 IMPLANT
SUT ETHIBOND NAB CT1 #1 30IN (SUTURE) ×3 IMPLANT
SUT VIC AB 0 CT1 27 (SUTURE) ×2
SUT VIC AB 0 CT1 27XBRD ANBCTR (SUTURE) ×1 IMPLANT
SUT VIC AB 1 CTX 36 (SUTURE) ×2
SUT VIC AB 1 CTX36XBRD ANBCTR (SUTURE) ×1 IMPLANT
SUT VIC AB 2-0 CT1 27 (SUTURE) ×2
SUT VIC AB 2-0 CT1 TAPERPNT 27 (SUTURE) ×1 IMPLANT
SUT VIC AB 3-0 CT1 27 (SUTURE) ×2
SUT VIC AB 3-0 CT1 TAPERPNT 27 (SUTURE) ×1 IMPLANT
SYR CONTROL 10ML LL (SYRINGE) ×9 IMPLANT
TRAY FOLEY MTR SLVR 16FR STAT (SET/KITS/TRAYS/PACK) ×3 IMPLANT
YANKAUER SUCT BULB TIP 10FT TU (MISCELLANEOUS) ×3 IMPLANT
YANKAUER SUCT BULB TIP NO VENT (SUCTIONS) ×3 IMPLANT

## 2018-12-29 NOTE — Anesthesia Postprocedure Evaluation (Signed)
Anesthesia Post Note  Patient: Curtis Mason  Procedure(s) Performed: TOTAL HIP ARTHROPLASTY ANTERIOR APPROACH (Left Hip)     Patient location during evaluation: PACU Anesthesia Type: Spinal Level of consciousness: oriented and awake and alert Pain management: pain level controlled Vital Signs Assessment: post-procedure vital signs reviewed and stable Respiratory status: spontaneous breathing, respiratory function stable and patient connected to nasal cannula oxygen Cardiovascular status: blood pressure returned to baseline and stable Postop Assessment: no headache, no backache and no apparent nausea or vomiting Anesthetic complications: no    Last Vitals:  Vitals:   12/29/18 1807 12/29/18 1930  BP: (!) 147/83 (!) 134/57  Pulse: 92 89  Resp: 18 18  Temp: 36.8 C 36.7 C  SpO2: 98% 100%    Last Pain:  Vitals:   12/29/18 1930  TempSrc: Oral  PainSc:                  Ardra Kuznicki

## 2018-12-29 NOTE — Anesthesia Preprocedure Evaluation (Addendum)
Anesthesia Evaluation    Reviewed: Allergy & Precautions, Patient's Chart, lab work & pertinent test results  History of Anesthesia Complications Negative for: history of anesthetic complications  Airway Mallampati: II  TM Distance: >3 FB Neck ROM: Full    Dental no notable dental hx. (+) Edentulous Upper, Partial Lower   Pulmonary neg pulmonary ROS, former smoker,    Pulmonary exam normal breath sounds clear to auscultation       Cardiovascular hypertension, Pt. on medications Normal cardiovascular exam Rhythm:Regular Rate:Normal     Neuro/Psych PSYCHIATRIC DISORDERS Depression negative neurological ROS     GI/Hepatic negative GI ROS, Neg liver ROS,   Endo/Other  diabetes, Insulin Dependent, Oral Hypoglycemic Agents  Renal/GU negative Renal ROS  negative genitourinary   Musculoskeletal  (+) Arthritis ,   Abdominal   Peds  Hematology  (+) REFUSES BLOOD PRODUCTS, JEHOVAH'S WITNESS  Anesthesia Other Findings   Reproductive/Obstetrics                           Anesthesia Physical Anesthesia Plan  ASA: III  Anesthesia Plan: Spinal   Post-op Pain Management:    Induction: Intravenous  PONV Risk Score and Plan: 1 and Propofol infusion and Treatment may vary due to age or medical condition  Airway Management Planned: Natural Airway, Nasal Cannula and Simple Face Mask  Additional Equipment: None  Intra-op Plan:   Post-operative Plan:   Informed Consent: I have reviewed the patients History and Physical, chart, labs and discussed the procedure including the risks, benefits and alternatives for the proposed anesthesia with the patient or authorized representative who has indicated his/her understanding and acceptance.     Dental advisory given  Plan Discussed with: CRNA, Anesthesiologist and Surgeon  Anesthesia Plan Comments: (Confirmed with patient without family present that he  refuses all blood products and albumin even to prevent death, stroke, or permanent brain damage.)       Anesthesia Quick Evaluation

## 2018-12-29 NOTE — Interval H&P Note (Signed)
History and Physical Interval Note:  12/29/2018 11:36 AM  Curtis Mason  has presented today for surgery, with the diagnosis of Left Hip Osteoarthritis.  The various methods of treatment have been discussed with the patient and family. After consideration of risks, benefits and other options for treatment, the patient has consented to  Procedure(s): TOTAL HIP ARTHROPLASTY ANTERIOR APPROACH (Left) as a surgical intervention.  The patient's history has been reviewed, patient examined, no change in status, stable for surgery.  I have reviewed the patient's chart and labs.  Questions were answered to the patient's satisfaction.     Kerin Salen

## 2018-12-29 NOTE — Evaluation (Signed)
Physical Therapy Evaluation Patient Details Name: Curtis MealyJames W Mason MRN: [id number] DOB: 03-29-1941 Today's Date: 12/29/2018   History of Present Illness  78 yo male s/p L DA-THA on 12/29/18. PMH includes OA, depression, DM, HTN, back surgery.  Clinical Impression  Pt presents with L hip pain, decreased L hip strength post-operatively, and decreased activity tolerance for mobility post-surgery. Pt to benefit from acute PT to address deficits. Pt ambulated hallway distance with RW with min guard assist, verbal cuing for form and safety provided. Pt educated on ankle pumps (20/hour) to perform this afternoon/evening to increase circulation, to pt's tolerance and limited by pain. PT to progress mobility as tolerated, and will continue to follow acutely.        Follow Up Recommendations Follow surgeon's recommendation for DC plan and follow-up therapies;Supervision for mobility/OOB(HHPT)    Equipment Recommendations  Other (comment)(may need RW if pt does not know where his is)    Recommendations for Other Services       Precautions / Restrictions Precautions Precautions: Fall Restrictions Weight Bearing Restrictions: No Other Position/Activity Restrictions: WBAT      Mobility  Bed Mobility Overal bed mobility: Needs Assistance Bed Mobility: Supine to Sit     Supine to sit: Min assist;HOB elevated     General bed mobility comments: Min assist for LLE lifting and translation to EOB. Increased time and effort.  Transfers Overall transfer level: Needs assistance Equipment used: Rolling walker (2 wheeled) Transfers: Sit to/from Stand Sit to Stand: Min guard;From elevated surface         General transfer comment: Min guard for safety, verbal cuing for hand placement when rising. Pt able to shift weight L and R without difficulty  Ambulation/Gait Ambulation/Gait assistance: Min guard Gait Distance (Feet): 50 Feet Assistive device: Rolling walker (2 wheeled) Gait  Pattern/deviations: Step-to pattern;Decreased step length - left;Decreased step length - right;Trunk flexed;Narrow base of support Gait velocity: decr   General Gait Details: Min guard for safety, verbal cuing for upright posture, sequencing, placement in RW, turning with RW.  Stairs            Wheelchair Mobility    Modified Rankin (Stroke Patients Only)       Balance Overall balance assessment: Mild deficits observed, not formally tested                                           Pertinent Vitals/Pain Pain Assessment: 0-10 Pain Score: 2  Pain Location: L hip, with mobility Pain Descriptors / Indicators: Discomfort Pain Intervention(s): Limited activity within patient's tolerance;Monitored during session;Premedicated before session;Repositioned;Ice applied    Home Living Family/patient expects to be discharged to:: Private residence Living Arrangements: Spouse/significant other Available Help at Discharge: Family;Available 24 hours/day Type of Home: House Home Access: Level entry     Home Layout: Two level Home Equipment: Cane - single point;Bedside commode;Walker - 2 wheels Additional Comments: Pt states he has a RW and BSC, but he doesn't know where they are    Prior Function Level of Independence: Independent with assistive device(s)         Comments: Pt reports using cane intermittently     Hand Dominance   Dominant Hand: Left    Extremity/Trunk Assessment   Upper Extremity Assessment Upper Extremity Assessment: Overall WFL for tasks assessed    Lower Extremity Assessment Lower Extremity Assessment: Overall WFL for tasks assessed;LLE deficits/detail  LLE Deficits / Details: suspected post-surgical hip weakness    Cervical / Trunk Assessment Cervical / Trunk Assessment: Normal  Communication   Communication: No difficulties  Cognition Arousal/Alertness: Awake/alert Behavior During Therapy: WFL for tasks  assessed/performed Overall Cognitive Status: Within Functional Limits for tasks assessed                                        General Comments      Exercises     Assessment/Plan    PT Assessment Patient needs continued PT services  PT Problem List Decreased strength;Decreased mobility;Decreased range of motion;Decreased activity tolerance;Decreased balance;Decreased knowledge of use of DME;Pain       PT Treatment Interventions DME instruction;Therapeutic activities;Gait training;Therapeutic exercise;Patient/family education;Balance training;Stair training;Functional mobility training    PT Goals (Current goals can be found in the Care Plan section)  Acute Rehab PT Goals PT Goal Formulation: With patient Time For Goal Achievement: 01/05/19 Potential to Achieve Goals: Good    Frequency 7X/week   Barriers to discharge        Co-evaluation               AM-PAC PT "6 Clicks" Mobility  Outcome Measure Help needed turning from your back to your side while in a flat bed without using bedrails?: A Little Help needed moving from lying on your back to sitting on the side of a flat bed without using bedrails?: A Little Help needed moving to and from a bed to a chair (including a wheelchair)?: A Little Help needed standing up from a chair using your arms (e.g., wheelchair or bedside chair)?: A Little Help needed to walk in hospital room?: A Little Help needed climbing 3-5 steps with a railing? : A Lot 6 Click Score: 17    End of Session Equipment Utilized During Treatment: Gait belt Activity Tolerance: Patient tolerated treatment well;Patient limited by fatigue Patient left: in chair;with call bell/phone within reach;with chair alarm set;with SCD's reapplied Nurse Communication: Mobility status PT Visit Diagnosis: Other abnormalities of gait and mobility (R26.89);Difficulty in walking, not elsewhere classified (R26.2)    Time: 2297-9892 PT Time  Calculation (min) (ACUTE ONLY): 25 min   Charges:   PT Evaluation $PT Eval Low Complexity: 1 Low PT Treatments $Gait Training: 8-22 mins      Julien Girt, PT Acute Rehabilitation Services Pager [phone]  Office [phone]   Curtis Mason 12/29/2018, 8:23 PM

## 2018-12-29 NOTE — Plan of Care (Signed)
progressing 

## 2018-12-29 NOTE — Care Plan (Signed)
Ortho Bundle Case Management Note  Patient Details  Name: Curtis Mason MRN: [id number] Date of Birth: [birth year]  Spoke with patient and wife prior to surgery. He will discharge to home with family and HHPT. Referral to [facility] in [place]. Please fax discharge information to them. Referral has already been sent. Patient prefers not to go to OPPT facility and MD is in agreement with this. Will not have this arranged. Patient and MD aware of plan and in agreement. Choice offered.                  [place] [phone] Fax   DME Arranged:    DME Agency:     HH Arranged:  PT HH Agency:  [facility]  Additional Comments: Please contact me with any questions of if this plan should need to change.  Ladell Heads,  [place] Specialist  [phone] 12/29/2018, 8:54 AM

## 2018-12-29 NOTE — Discharge Instructions (Signed)

## 2018-12-29 NOTE — Anesthesia Procedure Notes (Signed)
Procedure Name: Intubation Date/Time: 12/29/2018 12:55 PM Performed by: Mitzie Na, CRNA Pre-anesthesia Checklist: Patient identified, Emergency Drugs available, Suction available and Patient being monitored Patient Re-evaluated:Patient Re-evaluated prior to induction Oxygen Delivery Method: Circle system utilized Preoxygenation: Pre-oxygenation with 100% oxygen Induction Type: IV induction and Rapid sequence Ventilation: Mask ventilation without difficulty Laryngoscope Size: Mac and 3 Grade View: Grade I Tube type: Oral Tube size: 7.5 mm Number of attempts: 1 Airway Equipment and Method: Stylet and Oral airway Placement Confirmation: ETT inserted through vocal cords under direct vision,  positive ETCO2 and breath sounds checked- equal and bilateral Secured at: 24 cm Tube secured with: Tape Dental Injury: Teeth and Oropharynx as per pre-operative assessment

## 2018-12-29 NOTE — Op Note (Signed)
OPERATIVE REPORT    DATE OF PROCEDURE:  12/29/2018       PREOPERATIVE DIAGNOSIS:  Left Hip Osteoarthritis                                                          POSTOPERATIVE DIAGNOSIS:  Left Hip Osteoarthritis                                                           PROCEDURE: Anterior L total hip arthroplasty using a 54 mm DePuy Pinnacle  Cup, [place] [employer] Hole Eliminator, 0-degree polyethylene liner, a +1.5 mm x 36mm metal head, a 9hi Depuy Actis stem   SURGEON: Nestor LewandowskyFrank J Meir Elwood    ASSISTANT:   Tomi LikensEric K. [employer] PA-C  (present throughout entire procedure and necessary for timely completion of the procedure)   ANESTHESIA: Spinal BLOOD LOSS: 200 cc FLUID REPLACEMENT: 1600 cc crystalloid Antibiotic: 2gm ancef Tranexamic Acid: 1gm IV, 2gm Topical Exparel: 266mg  COMPLICATIONS: none    INDICATIONS FOR PROCEDURE: A 78 y.o. year-old With  Left Hip Osteoarthritis   for 3 years, x-rays show bone-on-bone arthritic changes, and osteophytes. Despite conservative measures with observation, anti-inflammatory medicine, narcotics, use of a cane, has severe unremitting pain and can ambulate only a few blocks before resting. Patient desires elective L total hip arthroplasty to decrease pain and increase function. The risks, benefits, and alternatives were discussed at length including but not limited to the risks of infection, bleeding, nerve injury, stiffness, blood clots, the need for revision surgery, cardiopulmonary complications, among others, and they were willing to proceed. Questions answered      PROCEDURE IN DETAIL: The patient was identified by armband,   received preoperative [facility], taken to the operating room , appropriate anesthetic monitors   were attached and  anesthesia was induced with the patient on the gurney. The HANA boots were applied to the feet and the patient  was transferred to the HANA table with a peroneal post and support  underneath the non-operative leg, which was locked in 2 lb traction. Theoperative lower extremity was then prepped and draped in the usual sterile fashion from just above the iliac crest to the knee. And a timeout procedure was performed. We then made a 12 cm incision along the interval at the leading edge of the tensor fascia lata of starting at 2 cm lateral to the ASIS. Small bleeders in the skin and subcutaneous tissue identified and cauterized we dissected down to the fascia and made an incision in the fascia allowing us to elevate the fascia of the tensor muscle and exploited the interval between the rectus and the tensor fascia lata. A Cobra retractor was then placed along the superior neck of the femur. A cerebellar retractor was used to expose the interval between the tensor fascia lata and the rectus femoris. .  We identified and cauterized the ascending branch of the anterior circumflex artery. A second Cobra retractor along the inferior neck of the femur. A small Hohmann retractor was placed underneath the origin of the rectus femoris, giving us good  medial exposure. Using Ronguers fatty tissue was removed from in front of the anterior capsule. The capsule was then incised, starting out at the superior anterior aspect of the acetabulum going laterally along the anterior neck. The capsule was then teed along the neck superiorly and inferiorly. Electrocautery was used to release capsule from the anterior and medial neck of the femur to allow external rotation. Cobra retractors were then placed along the inferior and superior neck allowing [place] to perform a standard neck cut and removed the femoral head with a power corkscrew. We then placed a medium bent homan retractor in the cotyloid notch and posteriorly along the acetabular rim a narrow Cobra retractor. Exposed labral tissue was then removed with the electrocautery. We then sequentially reamed up to a 53 mm basket reamer obtaining good coverage in all  quadrants, verified by C-arm imaging. Under C-arm control we then hammered into place a 54 mm Pinnacle cup in 45 of abduction and 15 of anteversion. The cup seated nicely and required no supplemental screws. We then placed a central hole Eliminator and a 0 polyethylene liner. The foot was then externally rotated to 130-140. The limb was extended and adducted delivering the proximal femur up into the wound. A medium curved Hohmann retractor was placed over the greater trochanter and a long Homan retractor along the posterior femoral neck completing the exposure. We then performed releases superiorly and and inferiorly of the capsule going back to the pirformis fossa superiorly and to the lesser trochanter inferiorly. We then entered the proximal femur with the box cutting offset chisel followed by, a canal sounder, the chili pepper and broaching up to a 9 broach. This seated nicely and we reamed the calcar. A trial reduction was performed with a 1.5 mm 36 mm head.The limb lengths were excellent the hip was stable in 90 of external rotation. At this point the trial components removed and we hammered into place a # 9 hi  Offset Actis stem with Gryption coating. A +1.5 mm x 36 metal head was then hammered into place. The hip was reduced and final C-arm images obtained. The wound was thoroughly irrigated with normal saline solution. We repaired the ant capsule and the tensor fascia lot a with running 0 vicryl suture. the subcutaneous tissue was closed with 2-0 and 3-0 Vicryl suture followed by an Aquacil dressing. At this point the patient was awaken and transferred to hospital gurney without difficulty.   Kerin Salen 12/29/2018, 2:10 PM

## 2018-12-29 NOTE — Transfer of Care (Signed)
Immediate Anesthesia Transfer of Care Note  Patient: Curtis Mason  Procedure(s) Performed: TOTAL HIP ARTHROPLASTY ANTERIOR APPROACH (Left Hip)  Patient Location: PACU  Anesthesia Type:General  Level of Consciousness: awake, drowsy and patient cooperative  Airway & Oxygen Therapy: Patient Spontanous Breathing and Patient connected to face mask oxygen  Post-op Assessment: Report given to RN, Post -op Vital signs reviewed and stable and Patient moving all extremities  Post vital signs: Reviewed and stable  Last Vitals:  Vitals Value Taken Time  BP 134/67 12/29/18 1456  Temp    Pulse 91 12/29/18 1458  Resp 13 12/29/18 1458  SpO2 100 % 12/29/18 1458  Vitals shown include unvalidated device data.  Last Pain:  Vitals:   12/29/18 1100  PainSc: 0-No pain      Patients Stated Pain Goal: 5 (01/00/71 2197)  Complications: No apparent anesthesia complications

## 2018-12-30 ENCOUNTER — Encounter (HOSPITAL_COMMUNITY): Payer: Self-pay | Admitting: Orthopedic Surgery

## 2018-12-30 DIAGNOSIS — M1612 Unilateral primary osteoarthritis, left hip: Secondary | ICD-10-CM | POA: Diagnosis not present

## 2018-12-30 LAB — BASIC METABOLIC PANEL
Anion gap: 10 (ref 5–15)
BUN: 18 mg/dL (ref 8–23)
CO2: 22 mmol/L (ref 22–32)
Calcium: 8.5 mg/dL — ABNORMAL LOW (ref 8.9–10.3)
Chloride: 104 mmol/L (ref 98–111)
Creatinine, Ser: 0.93 mg/dL (ref 0.61–1.24)
GFR calc Af Amer: >60 mL/min (ref >60)
GFR calc non Af Amer: >60 mL/min (ref >60)
Glucose, Bld: 307 mg/dL — ABNORMAL HIGH (ref 70–99)
Potassium: 4.6 mmol/L (ref 3.5–5.1)
Sodium: 136 mmol/L (ref 135–145)

## 2018-12-30 LAB — CBC
HCT: 35.9 % — ABNORMAL LOW (ref 39.0–52.0)
Hemoglobin: 11.2 g/dL — ABNORMAL LOW (ref 13.0–17.0)
MCH: 27.2 pg (ref 26.0–34.0)
MCHC: 31.2 g/dL (ref 30.0–36.0)
MCV: 87.1 fL (ref 80.0–100.0)
Platelets: 128 10*3/uL — ABNORMAL LOW (ref 150–400)
RBC: 4.12 MIL/uL — ABNORMAL LOW (ref 4.22–5.81)
RDW: 14 % (ref 11.5–15.5)
WBC: 12 10*3/uL — ABNORMAL HIGH (ref 4.0–10.5)
nRBC: 0 % (ref 0.0–0.2)

## 2018-12-30 LAB — GLUCOSE, CAPILLARY
Glucose-Capillary: 255 mg/dL — ABNORMAL HIGH (ref 70–99)
Glucose-Capillary: 151 mg/dL — ABNORMAL HIGH (ref 70–99)

## 2018-12-30 NOTE — Progress Notes (Signed)
PATIENT ID: Curtis Mason  MRN: [id number]  DOB/AGE:  March 06, 1941 / 78 y.o.  1 Day Post-Op Procedure(s) (LRB): TOTAL HIP ARTHROPLASTY ANTERIOR APPROACH (Left)    PROGRESS NOTE Subjective: Patient is alert, oriented, no Nausea, no Vomiting, yes passing gas, . Taking PO well. Denies SOB, Chest or Calf Pain. Using Incentive Spirometer, PAS in place. Ambulate in hallway Patient reports pain as  1/10  .    Objective: Vital signs in last 24 hours: Vitals:   12/29/18 1930 12/29/18 2237 12/30/18 0249 12/30/18 0629  BP: (Abnormal) 134/57 133/71 137/78 132/73  Pulse: 89 80 83 82  Resp: 18 18 15 16   Temp: 98 F (36.7 C) 97.7 F (36.5 C) 98 F (36.7 C) 98.1 F (36.7 C)  TempSrc: Oral Oral Oral Oral  SpO2: 100% 100% 98% 100%  Weight:      Height:          Intake/Output from previous day: I/O last 3 completed shifts: In: 3918.5 [P.O.:480; I.V.:3138.5; IV Piggyback:300] Out: 9470 [Urine:3275; Blood:200]   Intake/Output this shift: No intake/output data recorded.   LABORATORY DATA: Recent Labs    12/29/18 1508 12/29/18 1654 12/29/18 2057 12/30/18 0330  WBC  --   --   --  12.0*  HGB  --   --   --  11.2*  HCT  --   --   --  35.9*  PLT  --   --   --  128*  NA  --   --   --  136  K  --   --   --  4.6  CL  --   --   --  104  CO2  --   --   --  22  BUN  --   --   --  18  CREATININE  --   --   --  0.93  GLUCOSE  --   --   --  307*  GLUCAP 154* 145* 249*  --   CALCIUM  --   --   --  8.5*    Examination: Neurologically intact ABD soft Neurovascular intact Sensation intact distally Intact pulses distally Dorsiflexion/Plantar flexion intact Incision: dressing C/D/I No cellulitis present Compartment soft} XR AP&Lat of hip shows well placed\fixed THA  Assessment:   1 Day Post-Op Procedure(s) (LRB): TOTAL HIP ARTHROPLASTY ANTERIOR APPROACH (Left) ADDITIONAL DIAGNOSIS:  Expected Acute Blood Loss Anemia,   Patient's anticipated LOS is less than 2 midnights, meeting these  requirements: - Younger than 72 - Lives within 1 hour of care - Has a competent adult at home to recover with post-op recover - NO history of  - Chronic pain requiring opiods  - Diabetes  - Coronary Artery Disease  - Heart failure  - Heart attack  - Stroke  - DVT/VTE  - Cardiac arrhythmia  - Respiratory Failure/COPD  - Renal failure  - Anemia  - Advanced Liver disease       Plan: PT/OT WBAT, THA  DVT Prophylaxis: SCDx72 hrs, ASA 81 mg BID x 2 weeks  DISCHARGE PLAN: Home, today will be code 29  DISCHARGE NEEDS: HHPT, Walker and 3-in-1 comode seat

## 2018-12-30 NOTE — Progress Notes (Signed)
Physical Therapy Treatment Patient Details Name: Curtis Mason MRN: [id number] DOB: 04-09-1941 Today's Date: 12/30/2018    History of Present Illness 78 yo male s/p L DA-THA on 12/29/18. PMH includes OA, depression, DM, HTN, back surgery.    PT Comments    POD # 1 am session Assisted OOB. Demonstrated and instructed pt how to use belt to self assist LE as a leg lifter General transfer comment: Min guard for safety, verbal cuing for hand placement when rising. General Gait Details: Min guard for safety, verbal cuing for upright posture, sequencing, placement in RW, turning with RW. Then returned to room to perform some TE's following HEP handout.  Instructed on proper tech, freq as well as use of ICE.   Pt will need another session to complete HEP education.   Follow Up Recommendations  Follow surgeon's recommendation for DC plan and follow-up therapies;Supervision for mobility/OOB     Equipment Recommendations  Has a walker but doesn't know where it is.  Brother has one if he can't find his   Recommendations for Other Services       Precautions / Restrictions Precautions Precautions: Fall Restrictions Weight Bearing Restrictions: No Other Position/Activity Restrictions: WBAT    Mobility  Bed Mobility Overal bed mobility: Needs Assistance Bed Mobility: Supine to Sit     Supine to sit: Supervision;Min guard     General bed mobility comments: increased time  Transfers Overall transfer level: Needs assistance Equipment used: Rolling walker (2 wheeled) Transfers: Sit to/from Stand Sit to Stand: Min guard;From elevated surface;Supervision         General transfer comment: Min guard for safety, verbal cuing for hand placement when rising.  Ambulation/Gait Ambulation/Gait assistance: Min guard;Supervision Gait Distance (Feet): 75 Feet Assistive device: Rolling walker (2 wheeled) Gait Pattern/deviations: Step-to pattern;Decreased step length - left;Decreased step  length - right;Trunk flexed;Narrow base of support Gait velocity: decr   General Gait Details: Min guard for safety, verbal cuing for upright posture, sequencing, placement in RW, turning with RW.   Stairs             Wheelchair Mobility    Modified Rankin (Stroke Patients Only)       Balance                                            Cognition Arousal/Alertness: Awake/alert Behavior During Therapy: WFL for tasks assessed/performed Overall Cognitive Status: Within Functional Limits for tasks assessed                                        Exercises   Total Hip Replacement TE's 10 reps ankle pumps 10 reps knee presses 10 reps heel slides 10 reps SAQ's 10 reps ABD Followed by ICE    General Comments        Pertinent Vitals/Pain      Home Living                      Prior Function            PT Goals (current goals can now be found in the care plan section) Progress towards PT goals: Progressing toward goals    Frequency    7X/week      PT Plan Current plan remains  appropriate    Co-evaluation              AM-PAC PT "6 Clicks" Mobility   Outcome Measure  Help needed turning from your back to your side while in a flat bed without using bedrails?: A Little Help needed moving from lying on your back to sitting on the side of a flat bed without using bedrails?: A Little Help needed moving to and from a bed to a chair (including a wheelchair)?: A Little Help needed standing up from a chair using your arms (e.g., wheelchair or bedside chair)?: A Little Help needed to walk in hospital room?: A Little Help needed climbing 3-5 steps with a railing? : A Lot 6 Click Score: 17    End of Session Equipment Utilized During Treatment: Gait belt Activity Tolerance: Patient tolerated treatment well;Patient limited by fatigue Patient left: in chair;with call bell/phone within reach;with chair alarm set;with  SCD's reapplied Nurse Communication: Mobility status PT Visit Diagnosis: Other abnormalities of gait and mobility (R26.89);Difficulty in walking, not elsewhere classified (R26.2)     Time: [id number] PT Time Calculation (min) (ACUTE ONLY): 25 min  Charges:  $Gait Training: 8-22 mins $Therapeutic Exercise: 8-22 mins                     Felecia ShellingLori Ashaz Robling  PTA Acute  Rehabilitation Services Pager      [phone] Office      [phone]

## 2018-12-30 NOTE — Discharge Summary (Signed)
Patient ID: Curtis Mason MRN: [id number] DOB/AGE: 1941-05-21 78 y.o.  Admit date: 12/29/2018 Discharge date: 12/30/2018  Admission Diagnoses:  Principal Problem:   Osteoarthritis of left hip Active Problems:   H/O total hip arthroplasty, left   Discharge Diagnoses:  Same  Past Medical History:  Diagnosis Date  . Arthritis   . Depression   . Diabetes mellitus without complication (HCC)   . Hypertension     Surgeries: Procedure(s): TOTAL HIP ARTHROPLASTY ANTERIOR APPROACH on 12/29/2018   Consultants:   Discharged Condition: Improved  Hospital Course: Curtis Mason is an 78 y.o. male who was admitted 12/29/2018 for operative treatment ofOsteoarthritis of left hip. Patient has severe unremitting pain that affects sleep, daily activities, and work/hobbies. After pre-op clearance the patient was taken to the operating room on 12/29/2018 and underwent  Procedure(s): TOTAL HIP ARTHROPLASTY ANTERIOR APPROACH.    Patient was given perioperative antibiotics:  Anti-infectives (From admission, onward)   Start     Dose/Rate Route Frequency Ordered Stop   12/29/18 1015  ceFAZolin (ANCEF) IVPB 2g/100 mL premix     2 g 200 mL/hr over 30 Minutes Intravenous On call to O.R. 12/29/18 1010 12/29/18 1258       Patient was given sequential compression devices, early ambulation, and chemoprophylaxis to prevent DVT.  Patient benefited maximally from hospital stay and there were no complications.    Recent vital signs:  Patient Vitals for the past 24 hrs:  BP Temp Temp src Pulse Resp SpO2 Height Weight  12/30/18 0629 132/73 98.1 F (36.7 C) Oral 82 16 100 % - -  12/30/18 0249 137/78 98 F (36.7 C) Oral 83 15 98 % - -  12/29/18 2237 133/71 97.7 F (36.5 C) Oral 80 18 100 % - -  12/29/18 1930 (!) 134/57 98 F (36.7 C) Oral 89 18 100 % - -  12/29/18 1807 (!) 147/83 98.3 F (36.8 C) Oral 92 18 98 % - -  12/29/18 1718 (!) 154/79 98.2 F (36.8 C) Oral 98 20 100 % - -  12/29/18 1613 - -  - - - - 5\' 8"  (1.727 m) 92.2 kg  12/29/18 1610 (!) 149/82 97.8 F (36.6 C) Oral 86 13 100 % - -  12/29/18 1600 (!) 155/81 - - 86 11 99 % - -  12/29/18 1545 (!) 147/84 - - 85 10 99 % - -     Recent laboratory studies:  Recent Labs    12/30/18 0330  WBC 12.0*  HGB 11.2*  HCT 35.9*  PLT 128*  NA 136  K 4.6  CL 104  CO2 22  BUN 18  CREATININE 0.93  GLUCOSE 307*  CALCIUM 8.5*     Discharge Medications:   Allergies as of 12/30/2018      Reactions   Blood-group Specific Substance       Medication List    STOP taking these medications   meloxicam 7.5 MG tablet Commonly known as: MOBIC     TAKE these medications   aspirin EC 81 MG tablet Take 1 tablet (81 mg total) by mouth 2 (two) times daily.   atorvastatin 40 MG tablet Commonly known as: LIPITOR Take 40 mg by mouth daily.   brimonidine 0.2 % ophthalmic solution Commonly known as: ALPHAGAN Place 1 drop into both eyes 2 (two) times a day.   dorzolamide 2 % ophthalmic solution Commonly known as: TRUSOPT Place 1 drop into both eyes 2 (two) times daily.   DULoxetine 60 MG capsule  Commonly known as: CYMBALTA Take 60 mg by mouth daily.   insulin glargine 100 UNIT/ML injection Commonly known as: LANTUS Inject 30 Units into the skin at bedtime.   lisinopril 20 MG tablet Commonly known as: ZESTRIL Take 20 mg by mouth daily.   metFORMIN 1000 MG tablet Commonly known as: GLUCOPHAGE Take 1,000 mg by mouth 2 (two) times daily with a meal.   NOVOLOG FLEXPEN [place] Inject 5.5 Units into the skin 2 (two) times daily with a meal.   oxyCODONE-acetaminophen 5-325 MG tablet Commonly known as: PERCOCET/ROXICET Take 1 tablet by mouth every 4 (four) hours as needed for severe pain.   timolol 0.5 % ophthalmic solution Commonly known as: BETIMOL Place 2 drops into both eyes daily.   tiZANidine 2 MG tablet Commonly known as: ZANAFLEX Take 1 tablet (2 mg total) by mouth every 6 (six) hours as needed.   Vitamin D 50 MCG  (2000 UT) Caps Take 2,000 Units by mouth daily.            Durable Medical Equipment  (From admission, onward)         Start     Ordered   12/29/18 1613  DME Walker rolling  Once    Question:  Patient needs a walker to treat with the following condition  Answer:  Status post total hip replacement, left   12/29/18 1612   12/29/18 1613  DME 3 n 1  Once     12/29/18 1612           Discharge Care Instructions  (From admission, onward)         Start     Ordered   12/30/18 0000  Weight bearing as tolerated     12/30/18 1541          Diagnostic Studies: Dg Chest 2 View  Result Date: 12/24/2018 CLINICAL DATA:  78 year old male undergoing preoperative evaluation prior to hip replacement EXAM: CHEST - 2 VIEW COMPARISON:  None. FINDINGS: The lungs are clear and negative for focal airspace consolidation, pulmonary edema or suspicious pulmonary nodule. Prominent nipple shadows bilaterally. No pleural effusion or pneumothorax. Cardiac and mediastinal contours are within normal limits. No acute fracture or lytic or blastic osseous lesions. The visualized upper abdominal bowel gas pattern is unremarkable. IMPRESSION: No active cardiopulmonary disease. Electronically Signed   By: Jacqulynn Cadet M.D.   On: 12/24/2018 17:12   Dg C-arm 1-60 Min-no Report  Result Date: 12/29/2018 Fluoroscopy was utilized by the requesting physician.  No radiographic interpretation.   Dg Hip Operative Unilat With Pelvis Left  Result Date: 12/29/2018 CLINICAL DATA:  Left hip replacement. EXAM: OPERATIVE LEFT HIP (WITH PELVIS IF PERFORMED) 2 VIEWS TECHNIQUE: Fluoroscopic spot image(s) were submitted for interpretation post-operatively. COMPARISON:  No prior. FINDINGS: Total left hip replacement.  Hardware intact.  Anatomic alignment. IMPRESSION: Total left hip replacement.  Anatomic alignment. Electronically Signed   By: Marcello Moores  Register   On: 12/29/2018 14:16    Disposition: Discharge disposition:  01-Home or Self Care       Discharge Instructions    Call MD / Call 911   Complete by: As directed    If you experience chest pain or shortness of breath, CALL 911 and be transported to the hospital emergency room.  If you develope a fever above 101 F, pus (white drainage) or increased drainage or redness at the wound, or calf pain, call your surgeon's office.   Constipation Prevention   Complete by: As directed  Drink plenty of fluids.  Prune juice may be helpful.  You may use a stool softener, such as Colace (over the counter) 100 mg twice a day.  Use MiraLax (over the counter) for constipation as needed.   Diet - low sodium heart healthy   Complete by: As directed    Driving restrictions   Complete by: As directed    No driving for 2 weeks   Increase activity slowly as tolerated   Complete by: As directed    Patient may shower   Complete by: As directed    You may shower without a dressing once there is no drainage.  Do not wash over the wound.  If drainage remains, cover wound with plastic wrap and then shower.   Weight bearing as tolerated   Complete by: As directed       Follow-up Information    Gean Birchwoodowan, Frank, MD. Go on 01/13/2019.   Specialty: Orthopedic Surgery Why: Your appointment has been set for 9:45   Contact information: [address] [place] [address] [phone]        [facility] Follow up.   Why: You will have 5 HHPT visits prior to seeing Dr. Turner Danielsowan in follow up. Further decision will be made at that time.  Contact information: [address] [place] [id number] [phone]        Gean Birchwoodowan, Frank, MD In 2 weeks.   Specialty: Orthopedic Surgery Contact information: [address] [place] [address] [phone]            Signed: Dannielle Burnric Versa Craton 12/30/2018, 3:41 PM

## 2018-12-30 NOTE — Progress Notes (Signed)
Physical Therapy Treatment Patient Details Name: Curtis Mason MRN: [id number] DOB: 11-Sep-1940 Today's Date: 12/30/2018    History of Present Illness 78 yo male s/p L DA-THA on 12/29/18. PMH includes OA, depression, DM, HTN, back surgery.    PT Comments    POD # 1 pm session Assisted with amb an increased distance then returned to room to complete THR TE's following HEP handout.  Instructed on proper tech, freq as well as use of ICE.  Addressed all mobility questions, discussed appropriate activity, educated on use of ICE.  Pt ready for D/C to home.   Follow Up Recommendations  Follow surgeon's recommendation for DC plan and follow-up therapies;Supervision for mobility/OOB     Equipment Recommendations     Recommendations for Other Services       Precautions / Restrictions Precautions Precautions: Fall Restrictions Weight Bearing Restrictions: No Other Position/Activity Restrictions: WBAT    Mobility  Bed Mobility Overal bed mobility: Needs Assistance Bed Mobility: Supine to Sit     Supine to sit: Supervision;Min guard     General bed mobility comments: OOB in recliner  Transfers Overall transfer level: Needs assistance Equipment used: Rolling walker (2 wheeled) Transfers: Sit to/from Stand Sit to Stand: Min guard;From elevated surface;Supervision         General transfer comment: Min guard for safety, verbal cuing for hand placement when rising.  Ambulation/Gait Ambulation/Gait assistance: Min guard;Supervision Gait Distance (Feet): 82 Feet Assistive device: Rolling walker (2 wheeled) Gait Pattern/deviations: Step-to pattern;Decreased step length - left;Decreased step length - right;Trunk flexed;Narrow base of support Gait velocity: decr   General Gait Details: Min guard for safety, verbal cuing for upright posture, sequencing, placement in RW, turning with RW.   Stairs Stairs: (no stairs to enter home)           Wheelchair Mobility     Modified Rankin (Stroke Patients Only)       Balance                                            Cognition Arousal/Alertness: Awake/alert Behavior During Therapy: WFL for tasks assessed/performed Overall Cognitive Status: Within Functional Limits for tasks assessed                                        Exercises  10 reps all standing TE's following HEP handout    General Comments        Pertinent Vitals/Pain      Home Living                      Prior Function            PT Goals (current goals can now be found in the care plan section) Progress towards PT goals: Progressing toward goals    Frequency    7X/week      PT Plan Current plan remains appropriate    Co-evaluation              AM-PAC PT "6 Clicks" Mobility   Outcome Measure  Help needed turning from your back to your side while in a flat bed without using bedrails?: A Little Help needed moving from lying on your back to sitting on the side of a flat bed without using  bedrails?: A Little Help needed moving to and from a bed to a chair (including a wheelchair)?: A Little Help needed standing up from a chair using your arms (e.g., wheelchair or bedside chair)?: A Little Help needed to walk in hospital room?: A Little Help needed climbing 3-5 steps with a railing? : A Lot 6 Click Score: 17    End of Session Equipment Utilized During Treatment: Gait belt Activity Tolerance: Patient tolerated treatment well;Patient limited by fatigue Patient left: in chair;with call bell/phone within reach;with chair alarm set;with SCD's reapplied Nurse Communication: Mobility status PT Visit Diagnosis: Other abnormalities of gait and mobility (R26.89);Difficulty in walking, not elsewhere classified (R26.2)     Time: [id number] PT Time Calculation (min) (ACUTE ONLY): 25 min  Charges:  $Gait Training: 8-22 mins $Therapeutic Activity: 8-22 mins                      Felecia ShellingLori Saryna Kneeland  PTA Acute  Rehabilitation Services Pager      [phone] Office      [phone]

## 2018-12-30 NOTE — Care Management Obs Status (Signed)
[facility] NOTIFICATION   Patient Details  Name: Curtis Mason MRN: [id number] Date of Birth: Nov 02, 1940   Medicare Observation Status Notification Given:  Yes    Lia Hopping, Grady 12/30/2018, 3:05 PM

## 2018-12-30 NOTE — Care Management CC44 (Signed)
Condition Code 44 Documentation Completed  Patient Details  Name: Curtis Mason MRN: [id number] Date of Birth: Aug 28, 1940   Condition Code 44 given:  Yes Patient signature on Condition Code 44 notice:  Yes Documentation of 2 MD's agreement:  Yes Code 44 added to claim:  Yes    Lia Hopping, LCSW 12/30/2018, 3:05 PM

## 2019-11-21 IMAGING — DX CHEST - 2 VIEW
2 series · 2 of 2 positions shown · non-contrast
Comparison: None.

CLINICAL DATA: 78-year-old male undergoing preoperative evaluation
prior to hip replacement

EXAM:
CHEST - 2 VIEW

[chest pa]
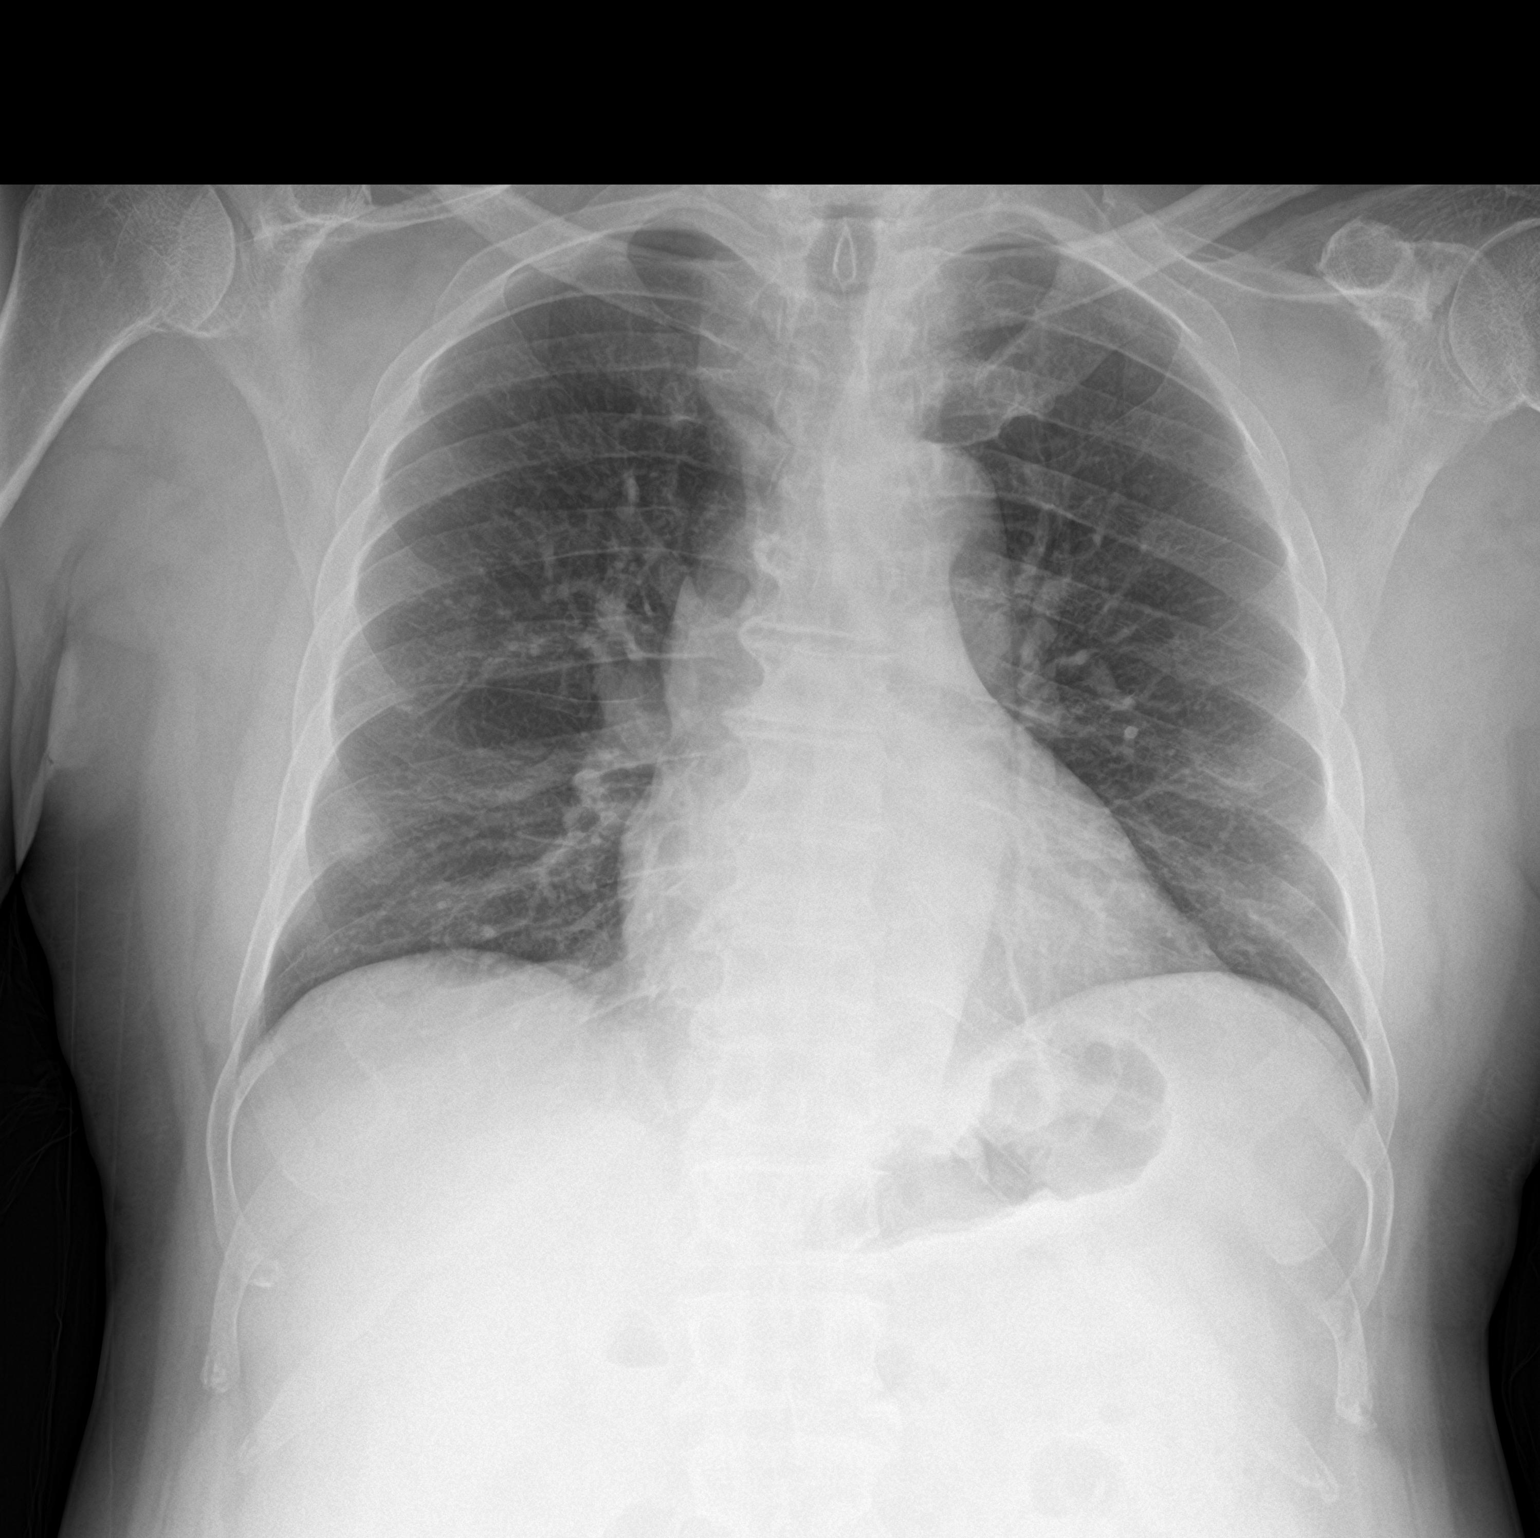

[chest lat]
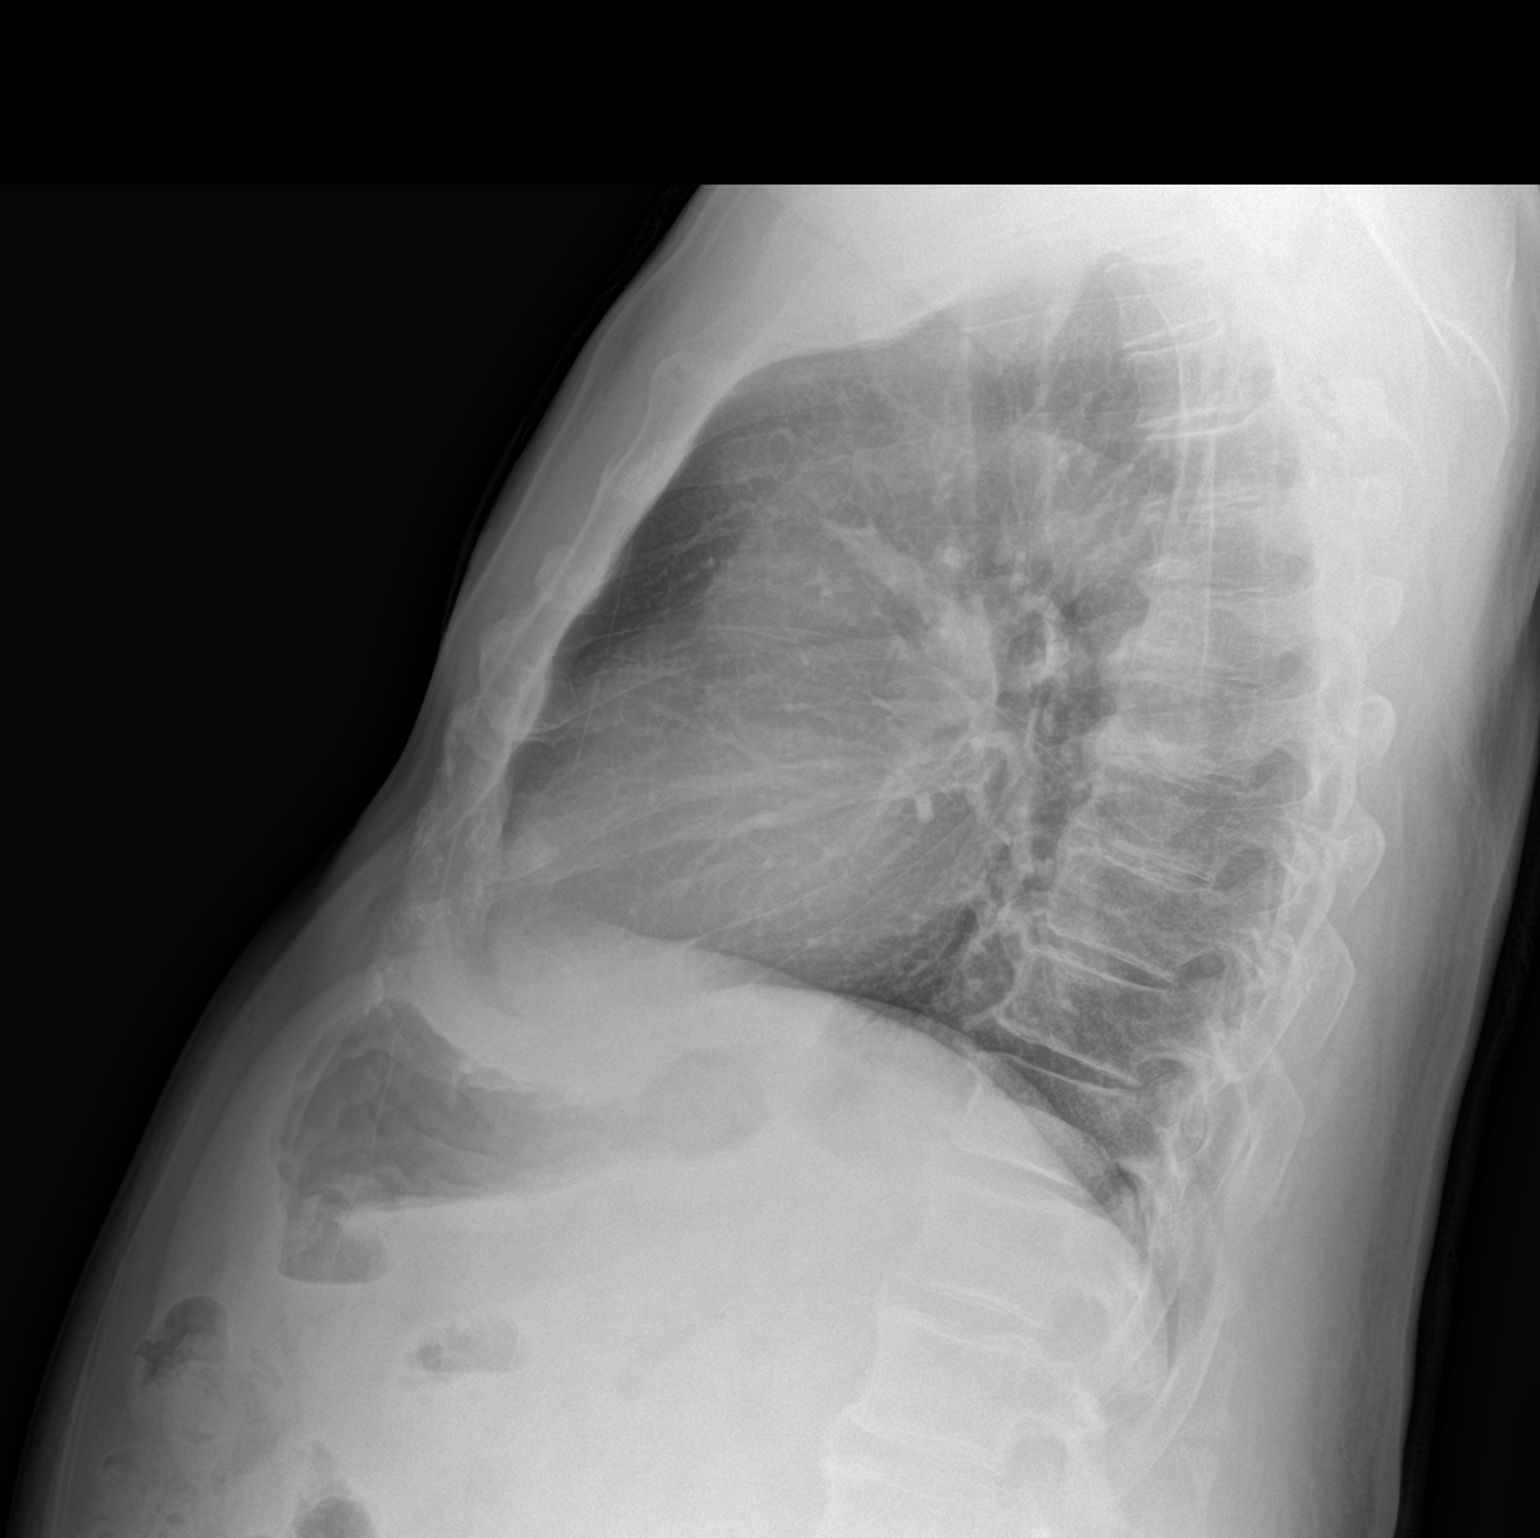

[2 of 2 positions shown; findings below may reference images not displayed]

FINDINGS: The lungs are clear and negative for focal airspace consolidation,
pulmonary edema or suspicious pulmonary nodule. Prominent nipple
shadows bilaterally. No pleural effusion or pneumothorax. Cardiac
and mediastinal contours are within normal limits. No acute fracture
or lytic or blastic osseous lesions. The visualized upper abdominal
bowel gas pattern is unremarkable.
IMPRESSION: No active cardiopulmonary disease.

## 2019-11-26 IMAGING — RF OPERATIVE LEFT HIP WITH PELVIS
1 series · 2 of 2 positions shown · non-contrast
Comparison: No prior.

CLINICAL DATA: Left hip replacement.

EXAM:
OPERATIVE LEFT HIP (WITH PELVIS IF PERFORMED) 2 VIEWS
TECHNIQUE: Fluoroscopic spot image(s) were submitted for interpretation
post-operatively.

[Series 1: run · 2 of 2 slices shown]
[im 1/2]
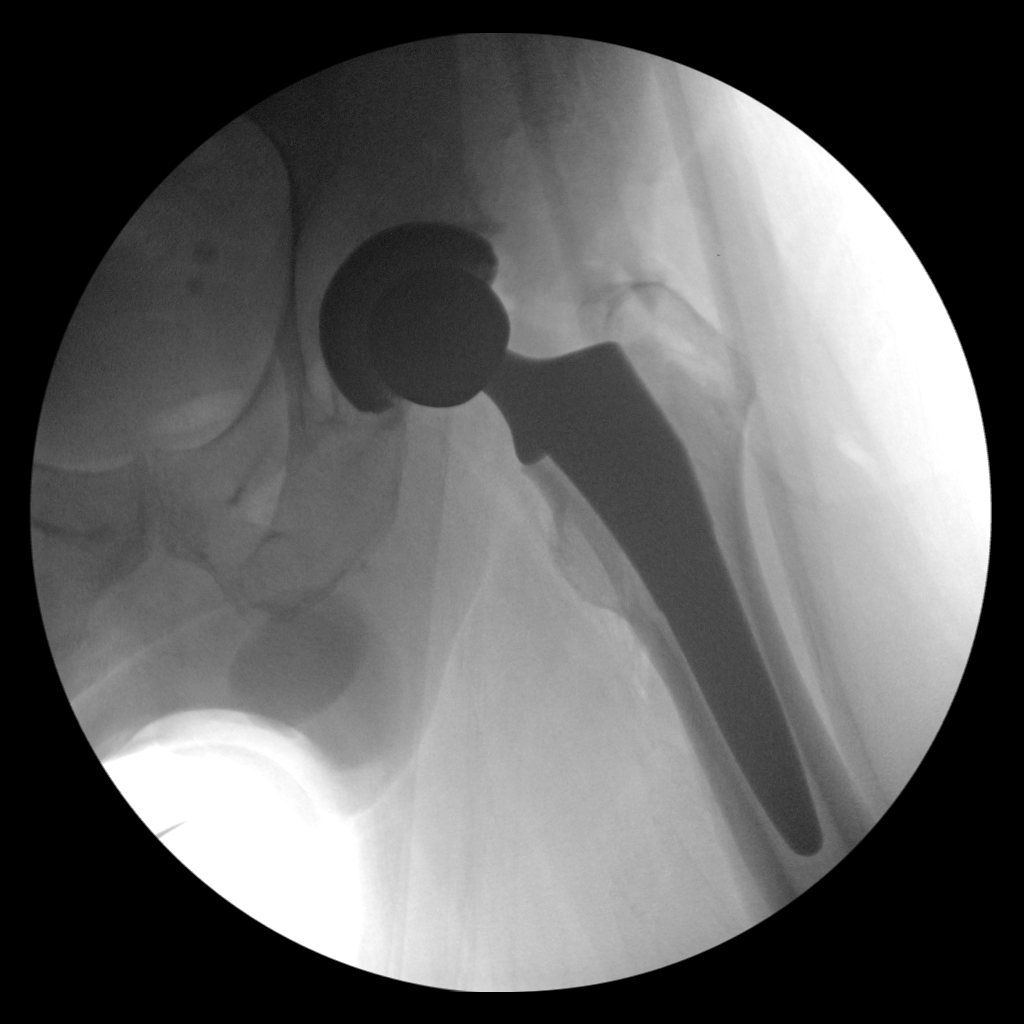
[im 2/2]
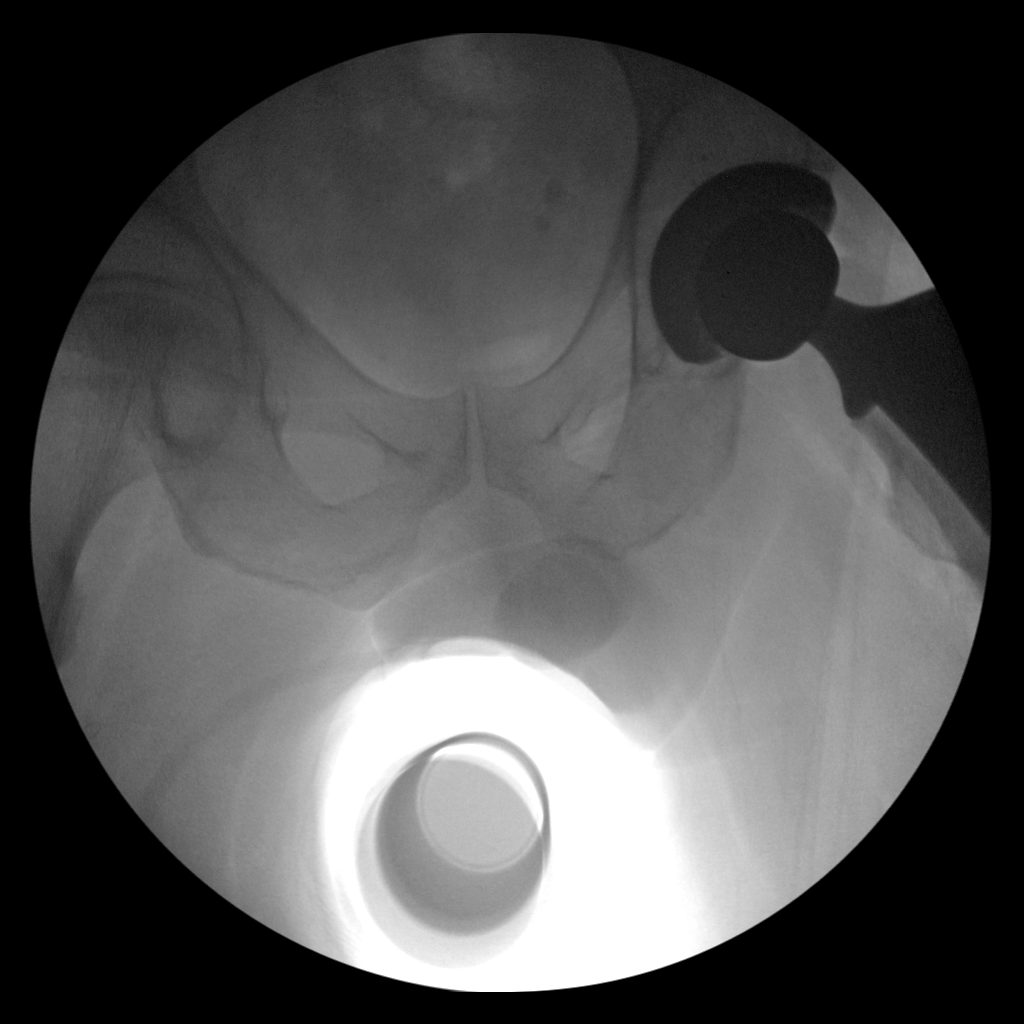

[2 of 2 positions shown; findings below may reference images not displayed]

FINDINGS: Total left hip replacement.  Hardware intact.  Anatomic alignment.
IMPRESSION: Total left hip replacement.  Anatomic alignment.

## 2020-11-22 ENCOUNTER — Other Ambulatory Visit: Payer: Self-pay

## 2020-11-22 ENCOUNTER — Ambulatory Visit (HOSPITAL_COMMUNITY)
Admission: RE | Admit: 2020-11-22 | Discharge: 2020-11-22 | Disposition: A | Discharge status: Home / Self Care | Payer: Medicare Other | Source: Ambulatory Visit | Attending: Orthopedic Surgery | Admitting: Orthopedic Surgery

## 2020-11-22 ENCOUNTER — Other Ambulatory Visit (HOSPITAL_COMMUNITY): Payer: Self-pay | Admitting: Orthopedic Surgery

## 2020-11-22 DIAGNOSIS — M79662 Pain in left lower leg: Secondary | ICD-10-CM | POA: Insufficient documentation

## 2020-11-22 DIAGNOSIS — Z9889 Other specified postprocedural states: Secondary | ICD-10-CM | POA: Insufficient documentation

## 2020-11-22 DIAGNOSIS — M7989 Other specified soft tissue disorders: Secondary | ICD-10-CM | POA: Diagnosis not present

## 2020-11-22 NOTE — Progress Notes (Signed)
Left lower extremity venous duplex has been completed. Preliminary results can be found in CV Proc through chart review.  Results were given to [facility] at Dr. Wadie Lessen office.  11/22/20 4:39 PM Olen Cordial RVT
